# Patient Record
Sex: Male | Born: 1995 | Race: White | Hispanic: No | Marital: Single | Smoking: Current every day smoker
Health system: Southern US, Community
[De-identification: ages and names within clinical notes are randomized; demographics above are authoritative.]

---
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
## 2016-04-24 ENCOUNTER — Other Ambulatory Visit: Payer: Self-pay | Admitting: Internal Medicine

## 2016-04-24 DIAGNOSIS — R1011 Right upper quadrant pain: Secondary | ICD-10-CM

## 2016-05-14 ENCOUNTER — Ambulatory Visit: Payer: BLUE CROSS/BLUE SHIELD

## 2016-05-14 ENCOUNTER — Ambulatory Visit: Admission: RE | Admit: 2016-05-14 | Payer: BLUE CROSS/BLUE SHIELD | Source: Ambulatory Visit

## 2016-05-23 ENCOUNTER — Encounter
Admission: RE | Admit: 2016-05-23 | Discharge: 2016-05-23 | Disposition: A | Discharge status: Home / Self Care | Payer: BLUE CROSS/BLUE SHIELD | Source: Ambulatory Visit | Attending: Internal Medicine | Admitting: Internal Medicine

## 2016-05-23 ENCOUNTER — Ambulatory Visit
Admission: RE | Admit: 2016-05-23 | Discharge: 2016-05-23 | Disposition: A | Discharge status: Home / Self Care | Payer: BLUE CROSS/BLUE SHIELD | Source: Ambulatory Visit | Attending: Internal Medicine | Admitting: Internal Medicine

## 2016-05-23 DIAGNOSIS — R1011 Right upper quadrant pain: Secondary | ICD-10-CM | POA: Diagnosis not present

## 2016-05-23 MED ORDER — TECHNETIUM TC 99M MEBROFENIN IV KIT
5.4300 | PACK | Freq: Once | INTRAVENOUS | Status: AC | PRN
  Administered 2016-05-23: 5.43 via INTRAVENOUS

## 2019-05-15 ENCOUNTER — Other Ambulatory Visit: Payer: Self-pay

## 2019-05-15 ENCOUNTER — Emergency Department
Admission: EM | Admit: 2019-05-15 | Discharge: 2019-05-15 | Disposition: A | Discharge status: Home / Self Care | Payer: BC Managed Care – PPO | Attending: Emergency Medicine | Admitting: Emergency Medicine

## 2019-05-15 ENCOUNTER — Encounter: Payer: Self-pay | Admitting: Emergency Medicine

## 2019-05-15 DIAGNOSIS — F1910 Other psychoactive substance abuse, uncomplicated: Secondary | ICD-10-CM | POA: Diagnosis not present

## 2019-05-15 DIAGNOSIS — Z5321 Procedure and treatment not carried out due to patient leaving prior to being seen by health care provider: Secondary | ICD-10-CM | POA: Diagnosis not present

## 2019-05-15 NOTE — ED Notes (Signed)
Patient to stat desk asking about wait time.

## 2019-05-15 NOTE — ED Notes (Signed)
Patient ambulatory to stat desk in no acute distress asking about wait time. Patient given update on wait time. Patient verbalizes understanding.  

## 2019-05-15 NOTE — ED Triage Notes (Addendum)
Patient states that he is withdrawing for percocet. Patient states that he last used about a day and half ago. Patient states that he uses 50 -60 mg a day. Patient states that he has been taking percocet for about a year. Patient states that he is suppose to go to [facility] on Monday.  Patient states that he is feeling aching and would like something for the symptoms to help him sleep.

## 2020-03-02 ENCOUNTER — Other Ambulatory Visit: Payer: Self-pay

## 2020-03-02 ENCOUNTER — Emergency Department
Admission: EM | Admit: 2020-03-02 | Discharge: 2020-03-03 | Disposition: A | Discharge status: Other Acute Inpatient Hospital | Payer: BC Managed Care – PPO | Source: Home / Self Care | Attending: Emergency Medicine | Admitting: Emergency Medicine

## 2020-03-02 DIAGNOSIS — F29 Unspecified psychosis not due to a substance or known physiological condition: Secondary | ICD-10-CM

## 2020-03-02 DIAGNOSIS — F19951 Other psychoactive substance use, unspecified with psychoactive substance-induced psychotic disorder with hallucinations: Secondary | ICD-10-CM | POA: Diagnosis not present

## 2020-03-02 DIAGNOSIS — F17200 Nicotine dependence, unspecified, uncomplicated: Secondary | ICD-10-CM | POA: Insufficient documentation

## 2020-03-02 DIAGNOSIS — Z20822 Contact with and (suspected) exposure to covid-19: Secondary | ICD-10-CM | POA: Insufficient documentation

## 2020-03-02 DIAGNOSIS — F13151 Sedative, hypnotic or anxiolytic abuse with sedative, hypnotic or anxiolytic-induced psychotic disorder with hallucinations: Secondary | ICD-10-CM | POA: Diagnosis not present

## 2020-03-02 LAB — URINE DRUG SCREEN, QUALITATIVE (ARMC ONLY)
Amphetamines, Ur Screen: POSITIVE — AB
Barbiturates, Ur Screen: NOT DETECTED
Benzodiazepine, Ur Scrn: POSITIVE — AB
Cannabinoid 50 Ng, Ur ~~LOC~~: POSITIVE — AB
Cocaine Metabolite,Ur ~~LOC~~: POSITIVE — AB
MDMA (Ecstasy)Ur Screen: NOT DETECTED
Methadone Scn, Ur: NOT DETECTED
Opiate, Ur Screen: NOT DETECTED
Phencyclidine (PCP) Ur S: NOT DETECTED
Tricyclic, Ur Screen: NOT DETECTED

## 2020-03-02 LAB — CBC
HCT: 40.6 % (ref 39.0–52.0)
Hemoglobin: 14.1 g/dL (ref 13.0–17.0)
MCH: 31.4 pg (ref 26.0–34.0)
MCHC: 34.7 g/dL (ref 30.0–36.0)
MCV: 90.4 fL (ref 80.0–100.0)
Platelets: 198 10*3/uL (ref 150–400)
RBC: 4.49 MIL/uL (ref 4.22–5.81)
RDW: 12.7 % (ref 11.5–15.5)
WBC: 5.7 10*3/uL (ref 4.0–10.5)
nRBC: 0 % (ref 0.0–0.2)

## 2020-03-02 LAB — COMPREHENSIVE METABOLIC PANEL
ALT: 165 U/L — ABNORMAL HIGH (ref 0–44)
AST: 66 U/L — ABNORMAL HIGH (ref 15–41)
Albumin: 4.4 g/dL (ref 3.5–5.0)
Alkaline Phosphatase: 69 U/L (ref 38–126)
Anion gap: 9 (ref 5–15)
BUN: 15 mg/dL (ref 6–20)
CO2: 25 mmol/L (ref 22–32)
Calcium: 9.4 mg/dL (ref 8.9–10.3)
Chloride: 104 mmol/L (ref 98–111)
Creatinine, Ser: 0.8 mg/dL (ref 0.61–1.24)
GFR calc Af Amer: >60 mL/min (ref >60)
GFR calc non Af Amer: >60 mL/min (ref >60)
Glucose, Bld: 90 mg/dL (ref 70–99)
Potassium: 3.9 mmol/L (ref 3.5–5.1)
Sodium: 138 mmol/L (ref 135–145)
Total Bilirubin: 1.2 mg/dL (ref 0.3–1.2)
Total Protein: 6.9 g/dL (ref 6.5–8.1)

## 2020-03-02 LAB — ETHANOL: Alcohol, Ethyl (B): <10 mg/dL (ref <10)

## 2020-03-02 MED ORDER — HALOPERIDOL LACTATE 5 MG/ML IJ SOLN
5.0000 mg | Freq: Once | INTRAMUSCULAR | Status: AC
  Administered 2020-03-02: 5 mg via INTRAMUSCULAR
  Filled 2020-03-02: qty 1

## 2020-03-02 MED ORDER — LORAZEPAM 2 MG/ML IJ SOLN
2.0000 mg | Freq: Once | INTRAMUSCULAR | Status: AC
  Administered 2020-03-02: 2 mg via INTRAMUSCULAR
  Filled 2020-03-02: qty 1

## 2020-03-02 MED ORDER — DIPHENHYDRAMINE HCL 50 MG/ML IJ SOLN
50.0000 mg | Freq: Once | INTRAMUSCULAR | Status: AC
  Administered 2020-03-02: 50 mg via INTRAMUSCULAR
  Filled 2020-03-02: qty 1

## 2020-03-02 NOTE — ED Notes (Signed)
Pt changed into behavorial clothing by this tech and EDT Wilson Singer, the following items were placed into belongings bag, labeled with pt sticker and placed at [employer] nurses (Amy) desk   Pt belongings include 3 pocket knife given to security officer [place] pair of shoes,  1 pair of black socks, 1 pair of kaki cargo shorts,  1 grey and red shirt,  1 back full of other belongings that were in a forensic bag from [employer], this tech and EDT Judeth Cornfield G did not remove items.  1 pair of blue and red plaid boxer shorts.

## 2020-03-02 NOTE — BH Assessment (Signed)
TTS consult unable to be complete due to patient not being able to participate in the interview, as a result of his incoherent stated of mind (intoxicated).

## 2020-03-02 NOTE — ED Notes (Signed)
Pt sleepy and unable to answer questions.

## 2020-03-02 NOTE — ED Notes (Signed)
Pt in hallway trying to leave, crying and yelling at officer.  EDP made aware and medications administered as ordered.

## 2020-03-02 NOTE — ED Notes (Signed)
Pt woke up and started walking around room, unsteady. Pt redirected back to bed.  Fall pads placed at bedside

## 2020-03-02 NOTE — ED Provider Notes (Signed)
[facility] Emergency Department Provider Note   ____________________________________________    I have reviewed the triage vital signs and the nursing notes.   HISTORY  Chief Complaint IVC     HPI Samuel Contreras is a 24 y.o. male brought in by police department under IVC.  Patient was apparently at the local [employer] opening up bottles of soda and milk and drinking them without paying for them and speaking with slurred speech.  He was apparently arrested and police found drugs in his wallet.  He became quite upset and made comments about hurting himself.  He states that he has painful blisters on his feet which is why he is walking unusually.  He does tell me that he "hates his life "but does not think that he would harm himself  History reviewed. No pertinent past medical history.  There are no problems to display for this patient.   History reviewed. No pertinent surgical history.  Prior to Admission medications   Not on File     Allergies Patient has no known allergies.  No family history on file.  Social History Social History   Tobacco Use  . Smoking status: Current Every Day Smoker  . Smokeless tobacco: Never Used  Substance Use Topics  . Alcohol use: Yes  . Drug use: Not on file    Comment: percocet    Review of Systems  Constitutional: No fever/chills Eyes: No visual changes.  ENT: No sore throat. Cardiovascular: Denies chest pain. Respiratory: Denies shortness of breath. Gastrointestinal: No abdominal pain.  No nausea, no vomiting.   Genitourinary: Negative for dysuria. Musculoskeletal: Painful feet Skin: Blisters to the feet as above Neurological: Negative for headaches or weakness   ____________________________________________   PHYSICAL EXAM:  VITAL SIGNS: ED Triage Vitals  Enc Vitals Group     BP 03/02/20 1244 135/87     Pulse Rate 03/02/20 1244 79     Resp 03/02/20 1244 18     Temp 03/02/20 1244 98 F  (36.7 C)     Temp Source 03/02/20 1244 Oral     SpO2 03/02/20 1244 96 %     Weight 03/02/20 1245 104.3 kg (230 lb)     Height 03/02/20 1245 1.829 m (6')     Head Circumference --      Peak Flow --      Pain Score 03/02/20 1254 0     Pain Loc --      Pain Edu? --      Excl. in [place]? --     Constitutional: Alert and oriented. Eyes: Conjunctivae are normal.  Head: Atraumatic.  Mouth/Throat: Mucous membranes are moist.   Neck:  Painless ROM Cardiovascular: Normal rate, regular rhythm. Kermit Balo peripheral circulation. Respiratory: Normal respiratory effort.  No retractions.   Musculoskeletal: No lower extremity tenderness nor edema.  Warm and well perfused.  Patient with large blisters to the plantar surface of the forefoot bilaterally Neurologic:  Normal speech and language. No gross focal neurologic deficits are appreciated.  Skin:  Skin is warm, dry and intact.  Psychiatric: Mood and affect are normal. Speech and behavior are normal.  ____________________________________________   LABS (all labs ordered are listed, but only abnormal results are displayed)  Labs Reviewed  COMPREHENSIVE METABOLIC PANEL - Abnormal; Notable for the following components:      Result Value   AST 66 (*)    ALT 165 (*)    All other components within normal limits  URINE DRUG SCREEN, QUALITATIVE ([facility] ONLY) - Abnormal; Notable for the following components:   Amphetamines, Ur Screen POSITIVE (*)    Cocaine Metabolite,Ur [place] POSITIVE (*)    Cannabinoid 50 Ng, Ur [place] POSITIVE (*)    Benzodiazepine, Ur Scrn POSITIVE (*)    All other components within normal limits  ETHANOL  CBC   ____________________________________________  EKG   ____________________________________________  RADIOLOGY   ____________________________________________   PROCEDURES  Procedure(s) performed: No  Procedures   Critical Care performed: No ____________________________________________   INITIAL IMPRESSION /  ASSESSMENT AND PLAN / ED COURSE  Pertinent labs & imaging results that were available during my care of the patient were reviewed by me and considered in my medical decision making (see chart for details).  Patient presents under IVC for unusual behavior, suicidal remarks to police.  Given his statements to me I will complete the IVC.  Lab work today is overall quite reassuring  UDS is positive for amphetamines cannabis benzos and cocaine  He is medically cleared for psychiatry consultation  The patient has been placed in psychiatric observation due to the need to provide a safe environment for the patient while obtaining psychiatric consultation and evaluation, as well as ongoing medical and medication management to treat the patient's condition.  The patient has been placed under full IVC at this time.       ____________________________________________   FINAL CLINICAL IMPRESSION(S) / ED DIAGNOSES  Final diagnoses:  None        Note:  This document was prepared using Dragon voice recognition software and may include unintentional dictation errors.   Jene Every, MD 03/02/20 [phone]

## 2020-03-02 NOTE — ED Triage Notes (Signed)
Brought to ED via IVC, pt stated that he wanted to hurt himself while at the jail. Pt possibility under influence of cocaine. Pt denies wanting to hurt himself now. Denies HI.

## 2020-03-02 NOTE — BH Assessment (Signed)
Patient is sedated and is currently unavailable to provide a meaningful assessment.

## 2020-03-02 NOTE — ED Notes (Signed)
Pt sitting on side of bed with his head in his lap.

## 2020-03-03 ENCOUNTER — Inpatient Hospital Stay
Admission: RE | Admit: 2020-03-03 | Discharge: 2020-03-06 | DRG: 897 | Disposition: A | Discharge status: Home / Self Care | Payer: BC Managed Care – PPO | Source: Intra-hospital | Attending: Psychiatry | Admitting: Psychiatry

## 2020-03-03 ENCOUNTER — Other Ambulatory Visit: Payer: Self-pay

## 2020-03-03 ENCOUNTER — Encounter: Payer: Self-pay | Admitting: Psychiatry

## 2020-03-03 DIAGNOSIS — F19951 Other psychoactive substance use, unspecified with psychoactive substance-induced psychotic disorder with hallucinations: Secondary | ICD-10-CM

## 2020-03-03 DIAGNOSIS — Z79899 Other long term (current) drug therapy: Secondary | ICD-10-CM

## 2020-03-03 DIAGNOSIS — S90822A Blister (nonthermal), left foot, initial encounter: Secondary | ICD-10-CM | POA: Diagnosis present

## 2020-03-03 DIAGNOSIS — F11151 Opioid abuse with opioid-induced psychotic disorder with hallucinations: Secondary | ICD-10-CM | POA: Diagnosis present

## 2020-03-03 DIAGNOSIS — Z8719 Personal history of other diseases of the digestive system: Secondary | ICD-10-CM

## 2020-03-03 DIAGNOSIS — S90821A Blister (nonthermal), right foot, initial encounter: Secondary | ICD-10-CM | POA: Diagnosis present

## 2020-03-03 DIAGNOSIS — F909 Attention-deficit hyperactivity disorder, unspecified type: Secondary | ICD-10-CM | POA: Diagnosis present

## 2020-03-03 DIAGNOSIS — F17210 Nicotine dependence, cigarettes, uncomplicated: Secondary | ICD-10-CM | POA: Diagnosis present

## 2020-03-03 DIAGNOSIS — F13151 Sedative, hypnotic or anxiolytic abuse with sedative, hypnotic or anxiolytic-induced psychotic disorder with hallucinations: Principal | ICD-10-CM | POA: Diagnosis present

## 2020-03-03 DIAGNOSIS — K219 Gastro-esophageal reflux disease without esophagitis: Secondary | ICD-10-CM | POA: Diagnosis present

## 2020-03-03 DIAGNOSIS — Z20822 Contact with and (suspected) exposure to covid-19: Secondary | ICD-10-CM | POA: Diagnosis present

## 2020-03-03 DIAGNOSIS — F419 Anxiety disorder, unspecified: Secondary | ICD-10-CM | POA: Diagnosis present

## 2020-03-03 DIAGNOSIS — F1310 Sedative, hypnotic or anxiolytic abuse, uncomplicated: Secondary | ICD-10-CM

## 2020-03-03 DIAGNOSIS — F1110 Opioid abuse, uncomplicated: Secondary | ICD-10-CM

## 2020-03-03 DIAGNOSIS — X58XXXA Exposure to other specified factors, initial encounter: Secondary | ICD-10-CM | POA: Diagnosis present

## 2020-03-03 DIAGNOSIS — R001 Bradycardia, unspecified: Secondary | ICD-10-CM | POA: Diagnosis present

## 2020-03-03 LAB — SARS CORONAVIRUS 2 BY RT PCR (HOSPITAL ORDER, PERFORMED IN ~~LOC~~ HOSPITAL LAB): SARS Coronavirus 2: NEGATIVE

## 2020-03-03 MED ORDER — LORAZEPAM 2 MG PO TABS
2.0000 mg | ORAL_TABLET | ORAL | Status: DC | PRN
  Filled 2020-03-03: qty 1

## 2020-03-03 MED ORDER — TRAZODONE HCL 50 MG PO TABS
50.0000 mg | ORAL_TABLET | Freq: Every evening | ORAL | Status: DC | PRN
  Administered 2020-03-04: 50 mg via ORAL
  Filled 2020-03-03: qty 1

## 2020-03-03 MED ORDER — MAGNESIUM HYDROXIDE 400 MG/5ML PO SUSP: 30.0000 mL | Freq: Every day | ORAL | Status: DC | PRN

## 2020-03-03 MED ORDER — ACETAMINOPHEN 325 MG PO TABS: 650.0000 mg | ORAL_TABLET | Freq: Four times a day (QID) | ORAL | Status: DC | PRN

## 2020-03-03 MED ORDER — ZIPRASIDONE MESYLATE 20 MG IM SOLR: 20.0000 mg | Freq: Once | INTRAMUSCULAR | Status: DC | PRN

## 2020-03-03 MED ORDER — HYDROXYZINE HCL 50 MG PO TABS
50.0000 mg | ORAL_TABLET | Freq: Three times a day (TID) | ORAL | Status: DC | PRN
  Administered 2020-03-03 – 2020-03-05 (×3): 50 mg via ORAL
  Filled 2020-03-03 (×3): qty 1

## 2020-03-03 MED ORDER — QUETIAPINE FUMARATE 25 MG PO TABS
50.0000 mg | ORAL_TABLET | Freq: Two times a day (BID) | ORAL | Status: DC
  Administered 2020-03-03 – 2020-03-05 (×4): 50 mg via ORAL
  Filled 2020-03-03 (×5): qty 2

## 2020-03-03 MED ORDER — ALUM & MAG HYDROXIDE-SIMETH 200-200-20 MG/5ML PO SUSP: 30.0000 mL | ORAL | Status: DC | PRN

## 2020-03-03 NOTE — ED Notes (Signed)
Pt given lunch tray.

## 2020-03-03 NOTE — ED Notes (Signed)
Report given to [place], [place]

## 2020-03-03 NOTE — ED Notes (Addendum)
Pt sleeping att and wouldn't wake to speak to TTS and psych   Pt with even and unlabored respirations

## 2020-03-03 NOTE — BH Assessment (Signed)
Late Entry- Writer received a phone call from patient's father asking for an update. Writer informed him he was going to be admitted to [facility]. Writer further informed him about having a different pass code for the unit, from the ER and if the patient didn't give it to him, he wouldn't be able to get any updates and staff cannot tell him if the patient is or isn't on the unit. Father voiced his understanding.

## 2020-03-03 NOTE — BH Assessment (Signed)
Assessment Note  Samuel Contreras is an 24 y.o. male who presents to the ER via law enforcement because he was in local grocery store with odd behaviors, erratic and incoherent. Per IVC patient was opening different drinks and other items eating them while walking around the store. Per the patient father, he unsure why he was brought to the ER. He received a phone call from law enforcement about his behaviors and what took place at the store. Father further reports of having concerns about the patient and his mental health.  During the interview, the patient was initially cooperative, calm and pleasant. However, patient became liable and tearful. Patient was having difficulty focusing on conversation. Patient UDS was positive for Amphetamines, Cocaine, Cannabis & Benzodiazepine. BAC was negative.  Diagnosis: Psychosis  Past Medical History: History reviewed. No pertinent past medical history.  History reviewed. No pertinent surgical history.  Family History: No family history on file.  Social History:  reports that he has been smoking. He has never used smokeless tobacco. He reports current alcohol use. No history on file for drug.  Additional Social History:  Alcohol / Drug Use Pain Medications: See PTA Prescriptions: See PTA Over the Counter: See PTA History of alcohol / drug use?: Yes Longest period of sobriety (when/how long): Unable to quantify Negative Consequences of Use: Personal relationships, Legal Substance #1 Name of Substance 1: Amphetamines Substance #2 Name of Substance 2: Cocaine Substance #3 Name of Substance 3: Cannabis Substance #4 Name of Substance 4: Benzodiazepine  CIWA: CIWA-Ar BP: (!) 103/46 Pulse Rate: 62 COWS:    Allergies: No Known Allergies  Home Medications: (Not in a hospital admission)   OB/GYN Status:  No LMP for male patient.  General Assessment Data Location of Assessment: [facility] ED TTS Assessment: In system Is this a Tele or Face-to-Face  Assessment?: Face-to-Face Is this an Initial Assessment or a Re-assessment for this encounter?: Initial Assessment Patient Accompanied by:: N/A Language Other than English: No Living Arrangements: Other (Comment)(Private Home) What gender do you identify as?: Male Marital status: Single Pregnancy Status: No Living Arrangements: Parent Can pt return to current living arrangement?: Yes Admission Status: Involuntary Petitioner: Police Is patient capable of signing voluntary admission?: No(Under IVC) Referral Source: Self/Family/Friend Insurance type: Insurance risk surveyor Exam ([place]) Medical Exam completed: Yes  Crisis Care Plan Living Arrangements: Parent Legal Guardian: Other:(Self) Name of Psychiatrist: Reports of none Name of Therapist: Reports of none  Education Status Is patient currently in school?: No Is the patient employed, unemployed or receiving disability?: Unemployed  Risk to self with the past 6 months Suicidal Ideation: No Has patient been a risk to self within the past 6 months prior to admission? : No Suicidal Intent: No Has patient had any suicidal intent within the past 6 months prior to admission? : No Is patient at risk for suicide?: No Suicidal Plan?: No Has patient had any suicidal plan within the past 6 months prior to admission? : No Access to Means: No What has been your use of drugs/alcohol within the last 12 months?: Amphetamines, Cocaine, Cannabis & Benzodiazepine Previous Attempts/Gestures: No How many times?: 0 Other Self Harm Risks: Active drug use Triggers for Past Attempts: None known Intentional Self Injurious Behavior: None Family Suicide History: Unknown Recent stressful life event(s): Conflict (Comment), Legal Issues, Other (Comment) Persecutory voices/beliefs?: No Depression: Yes Depression Symptoms: Tearfulness, Insomnia, Fatigue, Guilt, Loss of interest in usual pleasures, Feeling worthless/self pity Substance abuse  history and/or treatment for substance  abuse?: Yes Suicide prevention information given to non-admitted patients: Not applicable  Risk to Others within the past 6 months Homicidal Ideation: No Does patient have any lifetime risk of violence toward others beyond the six months prior to admission? : No Thoughts of Harm to Others: No Current Homicidal Intent: No Current Homicidal Plan: No Access to Homicidal Means: No Identified Victim: Reports of none History of harm to others?: No Assessment of Violence: None Noted Violent Behavior Description: Reports of none Does patient have access to weapons?: No Criminal Charges Pending?: No Does patient have a court date: No Is patient on probation?: No  Psychosis Hallucinations: None noted Delusions: None noted  Mental Status Report Appearance/Hygiene: Unremarkable, In scrubs Eye Contact: Fair Motor Activity: Freedom of movement, Unremarkable Speech: Unremarkable, Incoherent Level of Consciousness: Alert Mood: Anxious, Helpless, Irritable, Sad, Pleasant Affect: Appropriate to circumstance, Depressed, Anxious, Sad Anxiety Level: Minimal Thought Processes: Coherent, Relevant Judgement: Partial Orientation: Person, Place, Time, Situation, Appropriate for developmental age Obsessive Compulsive Thoughts/Behaviors: Moderate  Cognitive Functioning Concentration: Decreased Memory: Remote Intact, Recent Impaired Is patient IDD: No Insight: Fair Impulse Control: Fair Appetite: Good Have you had any weight changes? : No Change Sleep: Decreased Total Hours of Sleep: 5 Vegetative Symptoms: None  ADLScreening [facility] Assessment Services) Patient's cognitive ability adequate to safely complete daily activities?: Yes Patient able to express need for assistance with ADLs?: Yes Independently performs ADLs?: Yes (appropriate for developmental age)  Prior Inpatient Therapy Prior Inpatient Therapy: No  Prior Outpatient Therapy Prior Outpatient  Therapy: No Does patient have an ACCT team?: No Does patient have Intensive In-House Services?  : No Does patient have [facility] services? : No Does patient have P4CC services?: No  ADL Screening (condition at time of admission) Patient's cognitive ability adequate to safely complete daily activities?: Yes Is the patient deaf or have difficulty hearing?: No Does the patient have difficulty seeing, even when wearing glasses/contacts?: No Does the patient have difficulty concentrating, remembering, or making decisions?: No Patient able to express need for assistance with ADLs?: Yes Does the patient have difficulty dressing or bathing?: No Independently performs ADLs?: Yes (appropriate for developmental age) Does the patient have difficulty walking or climbing stairs?: No Weakness of Legs: None Weakness of Arms/Hands: None  Home Assistive Devices/Equipment Home Assistive Devices/Equipment: None  Therapy Consults (therapy consults require a physician order) PT Evaluation Needed: No OT Evalulation Needed: No SLP Evaluation Needed: No Abuse/Neglect Assessment (Assessment to be complete while patient is alone) Abuse/Neglect Assessment Can Be Completed: Yes Physical Abuse: Denies Verbal Abuse: Denies Sexual Abuse: Denies Exploitation of patient/patient's resources: Denies Self-Neglect: Denies Values / Beliefs Cultural Requests During Hospitalization: None Spiritual Requests During Hospitalization: None Consults Spiritual Care Consult Needed: No Transition of Care Team Consult Needed: No Advance Directives (For Healthcare) Does Patient Have a Medical Advance Directive?: No  Child/Adolescent Assessment Running Away Risk: Denies(Patient is an adult)  Disposition:  Disposition Initial Assessment Completed for this Encounter: Yes  On Site Evaluation by:   Reviewed with Physician:    Lilyan Gilford MS, LCAS, [facility], [facility] Therapeutic Triage Specialist 03/03/2020 2:49 PM

## 2020-03-03 NOTE — ED Notes (Signed)
Pt sleeping soundly, resting on right side prone, sides up for safety, even and unlabored respirations

## 2020-03-03 NOTE — BH Assessment (Signed)
  Pt was not awakened by voice and was unagreeable to complete assessment. Patient actively resists arousal Pt unable to provide history at this time due to AMS.

## 2020-03-03 NOTE — Consult Note (Signed)
[facility] Face-to-Face Psychiatry Consult   Reason for Consult: Bizarre behavior Referring Physician: EDP Patient Identification: Samuel Contreras MRN:  [id number] Principal Diagnosis: <principal problem not specified> Diagnosis:  Active Problems:   * No active hospital problems. *   Total Time spent with patient: 15 minutes  Subjective:   Samuel Contreras is a 24 y.o. male patient admitted after being admitted to the local emergency department by [employer].  Citing patient was seen in [place] with bizarre behavior.  Patient is denying suicidal or homicidal ideations.  However I do not see reports patient made statements about self-harm.  denies auditory or visual hallucinations.  However,   [place] presents with mood irritability, circumstantial and can be disorganized throughout this assessment.  Patient is very labile and requesting " discharge."  Patient did start crying as the nurse practitioner and TTS counselor left the unit. Patient then started yelling.  Will attempt to collect additional collateral.  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  Reports patient is prescribedm medications however has not taken medications in a while.  UDS reviewed patient positive for amphetamines, cocaine, benzodiazepines and marijuana.  Inpatient admission recommended for mood stabilization.  Staff to continue to monitor for safety.  Support, encouragement and  reassurance was provided.  HPI:  Per admission assessment: Samuel Contreras is a 24 y.o. male brought in by police department under IVC.  Patient was apparently at the local [employer] opening up bottles of soda and milk and drinking them without paying for them and speaking with slurred speech.  He was apparently arrested and police found drugs in his wallet.  He became quite upset and made comments about hurting himself.  He states that he has painful blisters on his feet which is why he is walking  unusually.  He does tell me that he "hates his life "but does not think that he would harm himself.   Past Psychiatric History:    Risk to Self:   Risk to Others:   Prior Inpatient Therapy:   Prior Outpatient Therapy:    Past Medical History: History reviewed. No pertinent past medical history. History reviewed. No pertinent surgical history. Family History: No family history on file. Family Psychiatric  History:  Social History:  Social History   Substance and Sexual Activity  Alcohol Use Yes     Social History   Substance and Sexual Activity  Drug Use Not on file   Comment: percocet    Social History   Socioeconomic History  . Marital status: Single    Spouse name: Not on file  . Number of children: Not on file  . Years of education: Not on file  . Highest education level: Not on file  Occupational History  . Not on file  Tobacco Use  . Smoking status: Current Every Day Smoker  . Smokeless tobacco: Never Used  Substance and Sexual Activity  . Alcohol use: Yes  . Drug use: Not on file    Comment: percocet  . Sexual activity: Not on file  Other Topics Concern  . Not on file  Social History Narrative  . Not on file   Social Determinants of Health   Financial Resource Strain:   . Difficulty of Paying Living Expenses:   Food Insecurity:   . Worried About Programme researcher, broadcasting/film/video in the Last Year:   . Barista in the Last Year:   Transportation Needs:   .  Lack of Transportation (Medical):   Marland Kitchen Lack of Transportation (Non-Medical):   Physical Activity:   . Days of Exercise per Week:   . Minutes of Exercise per Session:   Stress:   . Feeling of Stress :   Social Connections:   . Frequency of Communication with Friends and Family:   . Frequency of Social Gatherings with Friends and Family:   . Attends Religious Services:   . Active Member of Clubs or Organizations:   . Attends Banker Meetings:   Marland Kitchen Marital Status:    Additional Social  History:    Allergies:  No Known Allergies  Labs:  Results for orders placed or performed during the hospital encounter of 03/02/20 (from the past 48 hour(s))  Comprehensive metabolic panel     Status: Abnormal   Collection Time: 03/02/20 12:45 PM  Result Value Ref Range   Sodium 138 135 - 145 mmol/L   Potassium 3.9 3.5 - 5.1 mmol/L   Chloride 104 98 - 111 mmol/L   CO2 25 22 - 32 mmol/L   Glucose, Bld 90 70 - 99 mg/dL    Comment: Glucose reference range applies only to samples taken after fasting for at least 8 hours.   BUN 15 6 - 20 mg/dL   Creatinine, Ser 7.86 0.61 - 1.24 mg/dL   Calcium 9.4 8.9 - 75.4 mg/dL   Total Protein 6.9 6.5 - 8.1 g/dL   Albumin 4.4 3.5 - 5.0 g/dL   AST 66 (H) 15 - 41 U/L   ALT 165 (H) 0 - 44 U/L   Alkaline Phosphatase 69 38 - 126 U/L   Total Bilirubin 1.2 0.3 - 1.2 mg/dL   GFR calc non Af Amer >60 >60 mL/min   GFR calc Af Amer >60 >60 mL/min   Anion gap 9 5 - 15    Comment: Performed at [facility], [address] [place], [place] [address]  Ethanol     Status: None   Collection Time: 03/02/20 12:45 PM  Result Value Ref Range   Alcohol, Ethyl (B) <10 <10 mg/dL    Comment: (NOTE) Lowest detectable limit for serum alcohol is 10 mg/dL. For medical purposes only. Performed at [facility], [address] [place], [place] [address]   cbc     Status: None   Collection Time: 03/02/20 12:45 PM  Result Value Ref Range   WBC 5.7 4.0 - 10.5 K/uL   RBC 4.49 4.22 - 5.81 MIL/uL   Hemoglobin 14.1 13.0 - 17.0 g/dL   HCT 19.7 58.8 - 32.5 %   MCV 90.4 80.0 - 100.0 fL   MCH 31.4 26.0 - 34.0 pg   MCHC 34.7 30.0 - 36.0 g/dL   RDW 49.8 26.4 - 15.8 %   Platelets 198 150 - 400 K/uL   nRBC 0.0 0.0 - 0.2 %    Comment: Performed at [facility], [address] [place], [place] [address]  Urine Drug Screen, Qualitative     Status: Abnormal   Collection Time: 03/02/20 12:56 PM  Result Value Ref Range   Tricyclic, Ur Screen NONE  DETECTED NONE DETECTED   Amphetamines, Ur Screen POSITIVE (A) NONE DETECTED   MDMA (Ecstasy)Ur Screen NONE DETECTED NONE DETECTED   Cocaine Metabolite,Ur [place] POSITIVE (A) NONE DETECTED   Opiate, Ur Screen NONE DETECTED NONE DETECTED   Phencyclidine (PCP) Ur S NONE DETECTED NONE DETECTED   Cannabinoid 50 Ng, Ur [place] POSITIVE (A) NONE DETECTED   Barbiturates,  Ur Screen NONE DETECTED NONE DETECTED   Benzodiazepine, Ur Scrn POSITIVE (A) NONE DETECTED   Methadone Scn, Ur NONE DETECTED NONE DETECTED    Comment: (NOTE) Tricyclics + metabolites, urine    Cutoff 1000 ng/mL Amphetamines + metabolites, urine  Cutoff 1000 ng/mL MDMA (Ecstasy), urine              Cutoff 500 ng/mL Cocaine Metabolite, urine          Cutoff 300 ng/mL Opiate + metabolites, urine        Cutoff 300 ng/mL Phencyclidine (PCP), urine         Cutoff 25 ng/mL Cannabinoid, urine                 Cutoff 50 ng/mL Barbiturates + metabolites, urine  Cutoff 200 ng/mL Benzodiazepine, urine              Cutoff 200 ng/mL Methadone, urine                   Cutoff 300 ng/mL The urine drug screen provides only a preliminary, unconfirmed analytical test result and should not be used for non-medical purposes. Clinical consideration and professional judgment should be applied to any positive drug screen result due to possible interfering substances. A more specific alternate chemical method must be used in order to obtain a confirmed analytical result. Gas chromatography / mass spectrometry (GC/MS) is the preferred confirmat ory method. Performed at [facility], [address] [place], [place] [address]     No current facility-administered medications for this encounter.   Current Outpatient Medications  Medication Sig Dispense Refill  . amphetamine-dextroamphetamine (ADDERALL) 10 MG tablet Take 10 mg by mouth 2 (two) times daily.    . hydrOXYzine (ATARAX/VISTARIL) 25 MG tablet Take 25 mg by mouth 4 (four) times daily.       Musculoskeletal: Strength & Muscle Tone: within normal limits Gait & Station: normal Patient leans: N/A  Psychiatric Specialty Exam: Physical Exam  Review of Systems  Blood pressure (!) 103/46, pulse 62, temperature 97.9 F (36.6 C), temperature source Oral, resp. rate 16, height 6' (1.829 m), weight 104.3 kg, SpO2 99 %.Body mass index is 31.19 kg/m.  General Appearance: Disheveled  Eye Contact:  Good  Speech:  Clear and Coherent  Volume:  Normal  Mood:  Anxious and Depressed  Affect:  Appropriate  Thought Process:  Coherent  Orientation:  Full (Time, Place, and Person)  Thought Content:  Hallucinations: None and Rumination  Suicidal Thoughts:  No  Homicidal Thoughts:  No  Memory:  Immediate;   Fair Recent;   Fair  Judgement:  Fair  Insight:  Fair  Psychomotor Activity:  Normal  Concentration:  Concentration: Fair  Recall:  [employer] of Knowledge:  Fair  Language:  Fair  Akathisia:  No  Handed:  Right  AIMS (if indicated):     Assets:  Communication Skills Desire for Improvement Resilience Social Support  ADL's:  Intact  Cognition:  WNL  Sleep:      EDP made aware of discharge recommendation   Treatment Plan Summary: Daily contact with patient to assess and evaluate symptoms and progress in treatment and Medication management  Disposition: Recommend psychiatric Inpatient admission when medically cleared.   Restart home meds where appropriate Consider initiating Seroquel 50 mg p.o. nightly EKG, CBC, Prolactin, Lipid panel  and THS- pending results COIVD - Screening- pending results   Derrill Center, NP 03/03/2020 11:15 AM

## 2020-03-03 NOTE — Tx Team (Signed)
Initial Treatment Plan 03/03/2020 3:34 PM [name] [id number]    PATIENT STRESSORS: Financial difficulties Occupational concerns Substance abuse   PATIENT STRENGTHS: Ability for insight Active sense of humor Average or above average intelligence   PATIENT IDENTIFIED PROBLEMS: Substance Use  Unstable Mood                    DISCHARGE CRITERIA:  Ability to meet basic life and health needs Adequate post-discharge living arrangements  PRELIMINARY DISCHARGE PLAN: Return to previous living arrangement Return to previous work or school arrangements  PATIENT/FAMILY INVOLVEMENT: This treatment plan has been presented to and reviewed with the patient, Samuel Contreras.  The patient and family have been given the opportunity to ask questions and make suggestions.  Berkley Harvey, RN 03/03/2020, 3:34 PM

## 2020-03-03 NOTE — H&P (Signed)
Psychiatric Admission Assessment Adult  Patient Identification: Samuel Contreras N Molder MRN:  [id number] Date of Evaluation:  03/03/2020 Chief Complaint:  Substance-induced psychotic disorder with hallucinations (HCC) [F19.951] Principal Diagnosis: <principal problem not specified> Diagnosis:  Active Problems:   Substance-induced psychotic disorder with hallucinations [facility](HCC)  Mr. Luan PullingRich is a 24yo M with past psychiatric history of ADHD and medical history of gastritis, who [facility] due to bizarre behavior in public place.  History of Present Illness: [facility]. He appears very upset due to this admission, was tearful and yelling out during the phone conversation with his mother. He was very limitedly-cooperative with the interview. He refused to discuss circumstances of this admission. He reports "I am fine". He denies feeling depressed, reports feeling anxious and asks for medication for that. He denies suicidal or homicidal thoughts. He is unclear if he is feeling paranoid or unsafe. He asks to leave him alone and let him sleep. He let me to perform a quick physical exam though.  Per Psych Consult Note: Patient brought to ER by [employer], citing patient was seen in [employer] with bizarre behavior. Patient is denying suicidal or homicidal ideations.  However I do not see reports patient made statements about self-harm.  denies auditory or visual hallucinations.  However, Gerre PebblesGarrett presents with mood irritability, circumstantial and can be disorganized throughout this assessment.  Patient is very labile and requesting " discharge."  Patient did start crying as the nurse practitioner and TTS counselor left the unit. Patient then started yelling.  Will attempt to collect additional collateral.  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  Reports patient is prescribedm medications however has not taken  medications in a while.  UDS reviewed patient positive for amphetamines, cocaine, benzodiazepines and marijuana.  Inpatient admission recommended for mood stabilization.    Per ER admission assessment: Patient was apparently at the local [employer] opening up bottles of soda and milk and drinking them without paying for them and speaking with slurred speech.He was apparently arrested and police found drugs in his wallet. He became quite upset and made comments about hurting himself. He states that he has painful blisters on his feet which is why he is walking unusually. He does tell me that he "hates his life "but does not think that he would harm himself.   Per chart review I found the only note re [facility] on 01/[facility]: "Samuel Contreras N Fann is a 24 y.o. here for an acute issue. He has a PMH of ADHD, gastritis who presents for refills on Adderall and promethazine. He takes occasional hydroxyzine for anxiety. He also takes Adderall intermittently."  STATE PRESCRIPTION DRUG MONITORING PROGRAM: 12/12/2019 1 12/12/2019 Dextroamp-Amphetamin 10 Mg Tab 60.00 30 Ma Mil [id number] 786 ([id number]) 0/0  Comm Ins [place] 11/14/2019 1 11/14/2019 Dextroamp-Amphetamin 10 Mg Tab 60.00 30 Ma Mil [id number] 786 ([id number]) 0/0  Private Pay [place] 10/31/2019 1 10/04/2019 Dextroamp-Amphetamin 10 Mg Tab 60.00 30 Ma Mil [id number] 786 ([id number]) 0/0  Comm Ins [place] 09/30/2019 1 08/25/2019 Dextroamp-Amphetamin 10 Mg Tab 60.00 30 Ma Mil [id number] 786 ([id number]) 0/0  Comm Ins [place] 08/25/2019 1 06/30/2019 Dextroamp-Amphetamin 10 Mg Tab 30.00 30 Ma Mil [id number] 786 ([id number]) 0/0  Comm Ins [place] 07/27/2019 1 07/21/2019 Dextroamp-Amphetamin 10 Mg Tab 30.00 30 Ma Mil [id number] 786 ([id number]) 0/0  Comm Ins [place] 07/07/2019 1 07/07/2019 Buprenorphine-Nalox 8-2mg  Film 16.00 8 An  Ale [id number] 786 (3732) 0/0 16.00 mg Comm Ins [place] 06/30/2019 1 03/30/2019 Dextroamp-Amphetamin 10 Mg Tab 30.00 30 Ma Mil [id number] 786 ([id number]) 0/0  Comm  Ins [place] 04/28/2019 1 04/28/2019 Dextroamp-Amphetamin 10 Mg Tab 30.00 30 Ma Mil [id number] 786 ([id number]) 0/0  Comm Ins [place]   Past Psychiatric History:  Dx: ADHD No known past psych admissions. No known h/o suicidal attempts Past psych medications: per PDMP, Adderall 10mg  BID, last Rx from 12/12/19; one Rx for Suboxone 8-2mg  BID #16 for 8 days 07/07/19/    Total Time spent with patient: 30 minutes  Past Medical History: History reviewed. No pertinent past medical history. History reviewed. No pertinent surgical history. Family History: History reviewed. No pertinent family history. Family Psychiatric  History: unknown  Tobacco Screening: Have you used any form of tobacco in the last 30 days? (Cigarettes, Smokeless Tobacco, Cigars, and/or Pipes): Yes Tobacco use, Select all that apply: 5 or more cigarettes per day Are you interested in Tobacco Cessation Medications?: Yes, will notify MD for an order Counseled patient on smoking cessation including recognizing danger situations, developing coping skills and basic information about quitting provided: Refused/Declined practical counseling Social History:  Social History   Substance and Sexual Activity  Alcohol Use Yes     Social History   Substance and Sexual Activity  Drug Use Not on file   Comment: percocet    Additional Social History: unknown. Unable to obtain due to patient`s uncoperation.   Allergies:  No Known Allergies Lab Results:  Results for orders placed or performed during the hospital encounter of 03/02/20 (from the past 48 hour(s))  Comprehensive metabolic panel     Status: Abnormal   Collection Time: 03/02/20 12:45 PM  Result Value Ref Range   Sodium 138 135 - 145 mmol/L   Potassium 3.9 3.5 - 5.1 mmol/L   Chloride 104 98 - 111 mmol/L   CO2 25 22 - 32 mmol/L   Glucose, Bld 90 70 - 99 mg/dL    Comment: Glucose reference range applies only to samples taken after fasting for at least 8 hours.   BUN 15 6 - 20 mg/dL    Creatinine, Ser 03/04/20 0.61 - 1.24 mg/dL   Calcium 9.4 8.9 - 4.48 mg/dL   Total Protein 6.9 6.5 - 8.1 g/dL   Albumin 4.4 3.5 - 5.0 g/dL   AST 66 (H) 15 - 41 U/L   ALT 165 (H) 0 - 44 U/L   Alkaline Phosphatase 69 38 - 126 U/L   Total Bilirubin 1.2 0.3 - 1.2 mg/dL   GFR calc non Af Amer >60 >60 mL/min   GFR calc Af Amer >60 >60 mL/min   Anion gap 9 5 - 15    Comment: Performed at [facility], [address] [place], [place]  Ethanol     Status: None   Collection Time: 03/02/20 12:45 PM  Result Value Ref Range   Alcohol, Ethyl (B) <10 <10 mg/dL    Comment: (NOTE) Lowest detectable limit for serum alcohol is 10 mg/dL. For medical purposes only. Performed at [facility], [address] [place], [place]   cbc     Status: None   Collection Time: 03/02/20 12:45 PM  Result Value Ref Range   WBC 5.7 4.0 - 10.5 K/uL   RBC 4.49 4.22 - 5.81 MIL/uL   Hemoglobin 14.1 13.0 - 17.0 g/dL   HCT 03/04/20 78.5 - 88.5 %   MCV 90.4 80.0 - 100.0 fL  MCH 31.4 26.0 - 34.0 pg   MCHC 34.7 30.0 - 36.0 g/dL   RDW 85.6 31.4 - 97.0 %   Platelets 198 150 - 400 K/uL   nRBC 0.0 0.0 - 0.2 %    Comment: Performed at [facility], [address], [place], [place] [address]  Urine Drug Screen, Qualitative     Status: Abnormal   Collection Time: 03/02/20 12:56 PM  Result Value Ref Range   Tricyclic, Ur Screen NONE DETECTED NONE DETECTED   Amphetamines, Ur Screen POSITIVE (A) NONE DETECTED   MDMA (Ecstasy)Ur Screen NONE DETECTED NONE DETECTED   Cocaine Metabolite,Ur [place] POSITIVE (A) NONE DETECTED   Opiate, Ur Screen NONE DETECTED NONE DETECTED   Phencyclidine (PCP) Ur S NONE DETECTED NONE DETECTED   Cannabinoid 50 Ng, Ur [place] POSITIVE (A) NONE DETECTED   Barbiturates, Ur Screen NONE DETECTED NONE DETECTED   Benzodiazepine, Ur Scrn POSITIVE (A) NONE DETECTED   Methadone Scn, Ur NONE DETECTED NONE DETECTED    Comment: (NOTE) Tricyclics + metabolites, urine     Cutoff 1000 ng/mL Amphetamines + metabolites, urine  Cutoff 1000 ng/mL MDMA (Ecstasy), urine              Cutoff 500 ng/mL Cocaine Metabolite, urine          Cutoff 300 ng/mL Opiate + metabolites, urine        Cutoff 300 ng/mL Phencyclidine (PCP), urine         Cutoff 25 ng/mL Cannabinoid, urine                 Cutoff 50 ng/mL Barbiturates + metabolites, urine  Cutoff 200 ng/mL Benzodiazepine, urine              Cutoff 200 ng/mL Methadone, urine                   Cutoff 300 ng/mL The urine drug screen provides only a preliminary, unconfirmed analytical test result and should not be used for non-medical purposes. Clinical consideration and professional judgment should be applied to any positive drug screen result due to possible interfering substances. A more specific alternate chemical method must be used in order to obtain a confirmed analytical result. Gas chromatography / mass spectrometry (GC/MS) is the preferred confirmat ory method. Performed at [facility], [address] [place], [place] [address]   SARS Coronavirus 2 by RT PCR (hospital order, performed in [facility] hospital lab) Nasopharyngeal Nasopharyngeal Swab     Status: None   Collection Time: 03/03/20 12:42 PM   Specimen: Nasopharyngeal Swab  Result Value Ref Range   SARS Coronavirus 2 NEGATIVE NEGATIVE    Comment: (NOTE) SARS-CoV-2 target nucleic acids are NOT DETECTED. The SARS-CoV-2 RNA is generally detectable in upper and lower respiratory specimens during the acute phase of infection. The lowest concentration of SARS-CoV-2 viral copies this assay can detect is 250 copies / mL. A negative result does not preclude SARS-CoV-2 infection and should not be used as the sole basis for treatment or other patient management decisions.  A negative result may occur with improper specimen collection / handling, submission of specimen other than nasopharyngeal swab, presence of viral mutation(s) within  the areas targeted by this assay, and inadequate number of viral copies (<250 copies / mL). A negative result must be combined with clinical observations, patient history, and epidemiological information. Fact Sheet for Patients:   BoilerBrush.com.cy Fact Sheet for Healthcare Providers: https://pope.com/ This test is not yet approved or  cleared  by the [place] and has been authorized for detection and/or diagnosis of SARS-CoV-2 by FDA under an Emergency Use Authorization (EUA).  This EUA will remain in effect (meaning this test can be used) for the duration of the COVID-19 declaration under Section 564(b)(1) of the Act, 21 U.S.C. section 360bbb-3(b)(1), unless the authorization is terminated or revoked sooner. Performed at [facility], [address] [place], [place] [address]     Blood Alcohol level:  Lab Results  Component Value Date   [facility] <10 03/02/2020    Metabolic Disorder Labs:  No results found for: HGBA1C, MPG No results found for: PROLACTIN No results found for: CHOL, TRIG, HDL, CHOLHDL, VLDL, LDLCALC  Current Medications: Current Facility-Administered Medications  Medication Dose Route Frequency Provider Last Rate Last Admin  . acetaminophen (TYLENOL) tablet 650 mg  650 mg Oral Q6H PRN Oneta Rack, NP      . alum & mag hydroxide-simeth (MAALOX/MYLANTA) 200-200-20 MG/5ML suspension 30 mL  30 mL Oral Q4H PRN Oneta Rack, NP      . ziprasidone (GEODON) injection 20 mg  20 mg Intramuscular Once PRN Oneta Rack, NP       And  . LORazepam (ATIVAN) tablet 2 mg  2 mg Oral PRN Oneta Rack, NP      . magnesium hydroxide (MILK OF MAGNESIA) suspension 30 mL  30 mL Oral Daily PRN Oneta Rack, NP      . QUEtiapine (SEROQUEL) tablet 50 mg  50 mg Oral BID Oneta Rack, NP      . traZODone (DESYREL) tablet 50 mg  50 mg Oral QHS PRN Oneta Rack, NP       PTA Medications: Medications  Prior to Admission  Medication Sig Dispense Refill Last Dose  . amphetamine-dextroamphetamine (ADDERALL) 10 MG tablet Take 10 mg by mouth 2 (two) times daily.     . hydrOXYzine (ATARAX/VISTARIL) 25 MG tablet Take 25 mg by mouth 4 (four) times daily.       Musculoskeletal: Strength & Muscle Tone: within normal limits Gait & Station: normal Patient leans: N/A  Psychiatric Specialty Exam: Physical Exam  Constitutional: He appears well-developed and well-nourished.  HENT:  Head: Normocephalic and atraumatic.  Eyes: Pupils are equal, round, and reactive to light. EOM are normal.  Cardiovascular: Normal rate, regular rhythm and normal heart sounds.  Respiratory: Effort normal and breath sounds normal.  GI: Soft.  Musculoskeletal:     Cervical back: Normal range of motion.  Neurological: He is alert.  Skin: Skin is warm and dry.    Review of Systems  Constitutional: Negative for chills and fever.  HENT: Negative for congestion.   Respiratory: Negative for cough and shortness of breath.   Cardiovascular: Negative for chest pain.  Gastrointestinal: Negative for abdominal pain.  Neurological: Negative for tremors and weakness.  Psychiatric/Behavioral: Positive for agitation, behavioral problems and dysphoric mood. The patient is nervous/anxious.     Blood pressure 125/89, pulse 82, temperature 98 F (36.7 C), temperature source Oral, resp. rate 18, height 6' (1.829 m), weight 102.5 kg, SpO2 97 %.Body mass index is 30.65 kg/m.  General Appearance: Negative  Eye Contact:  Absent  Speech:  Garbled  Volume:  Increased  Mood:  Angry, Dysphoric and Irritable  Affect:  Congruent  Thought Process:  Disorganized  Orientation:  Full (Time, Place, and Person)  Thought Content:  Illogical  Suicidal Thoughts:  No  Homicidal Thoughts:  No  Memory:  Immediate;   Fair Recent;   Poor  Judgement:  Impaired  Insight:  Lacking  Psychomotor Activity:  Increased  Concentration:  Concentration:  Poor  Recall:  [place] of Knowledge:  NA  Language:  Fair  Akathisia:  No  Handed:  Right  AIMS (if indicated):     Assets:  Housing Physical Health  ADL's:  Intact  Cognition:  Impaired,  Mild  Sleep:       Treatment Plan Summary: Daily contact with patient to assess and evaluate symptoms and progress in treatment and Medication management    ASSESSMENT: 24yo M with past psychiatric history of ADHD and medical history of gastritis, who was admitted to [facility] unit due to bizarre behavior in public place. Reportedly, he was acting bizarre in grocery store, was apparently arrested and police found drugs in his wallet. He became quite upset and made comments about hurting himself. UDS in ER reviewed patient positive for amphetamines, cocaine, benzodiazepines and marijuana. Currently patient is agitated, but not aggressive; he is minimally-cooperative with the interview, denies suicidal or homicidal thoughts, making unclear paranoid statements.  Psych consultant from ER has already ordered Seroquel 50mg  PO BID and that will be continued, as well as agitation protocol with Geodon and Lorazepam and Trazodone PRN sleep. I added PRN Hydroxyzine TID for anxiety. Patient will be reassessed tomorrow. Will attempt to obtain collateral info from his family. Labwork reviewed, CBC, CMP - grossly unremarkable. BAL <10. UDS as mentioned above. Will order EKG.  Impression: Substance-induced psychosis.  Plan: as mentioned in Assessment above. continue inpatient psych admission; 15-minute checks; daily contact with patient to assess and evaluate symptoms and progress in treatment; psychoeducation.  Observation Level/Precautions:  46min checks  Laboratory:  na   Psychotherapy:    Medications:    Consultations:    Discharge Concerns:    Estimated LOS:  Other:     Physician Treatment Plan for Primary Diagnosis: <principal problem not specified> Long Term Goal(s): Improvement in symptoms so as ready for  discharge  Short Term Goals: Ability to identify changes in lifestyle to reduce recurrence of condition will improve, Ability to verbalize feelings will improve, Ability to disclose and discuss suicidal ideas, Ability to demonstrate self-control will improve, Ability to identify and develop effective coping behaviors will improve, Ability to maintain clinical measurements within normal limits will improve, Compliance with prescribed medications will improve and Ability to identify triggers associated with substance abuse/mental health issues will improve  Physician Treatment Plan for Secondary Diagnosis: Active Problems:   Substance-induced psychotic disorder with hallucinations ([place])  Long Term Goal(s): Improvement in symptoms so as ready for discharge  Short Term Goals: Ability to identify changes in lifestyle to reduce recurrence of condition will improve, Ability to verbalize feelings will improve, Ability to disclose and discuss suicidal ideas, Ability to demonstrate self-control will improve, Ability to identify and develop effective coping behaviors will improve, Ability to maintain clinical measurements within normal limits will improve, Compliance with prescribed medications will improve and Ability to identify triggers associated with substance abuse/mental health issues will improve  I certify that inpatient services furnished can reasonably be expected to improve the patient's condition.    Larita Fife, MD 5/22/20213:55 PM

## 2020-03-03 NOTE — BHH Suicide Risk Assessment (Signed)
[facility] Admission Suicide Risk Assessment   Nursing information obtained from:  Patient Demographic factors:  Male, Unemployed, Low socioeconomic status, Caucasian Current Mental Status:  NA Loss Factors:  Financial problems / change in socioeconomic status Historical Factors:  Impulsivity Risk Reduction Factors:  Living with another person, especially a relative, Sense of responsibility to family  Total Time spent with patient: 20 minutes Principal Problem: <principal problem not specified> Diagnosis:  Active Problems:   Substance-induced psychotic disorder with hallucinations [facility])  Mr. Trentman is a 24yo M with past psychiatric history of ADHD and medical history of gastritis, who was admitted to [facility] unit due to bizarre behavior in public place.  Subjective Data: patient denies suicidal ideations currently.   CLINICAL FACTORS:   Alcohol/Substance Abuse/Dependencies  COGNITIVE FEATURES THAT CONTRIBUTE TO RISK:  None    SUICIDE RISK:   Mild:  Suicidal ideation of limited frequency, intensity, duration, and specificity.  There are no identifiable plans, no associated intent, mild dysphoria and related symptoms, good self-control (both objective and subjective assessment), few other risk factors, and identifiable protective factors, including available and accessible social support.  PLAN OF CARE: continue inpatient psych admission; 15-minute checks; daily contact with patient to assess and evaluate symptoms and progress in treatment; psychoeducation. Continue Seroquel 50mg  PO BID, as well as agitation protocol with Geodon and Lorazepam and Trazodone PRN sleep. I added PRN Hydroxyzine TID for anxiety. Patient will be reassessed tomorrow. Will attempt to obtain collateral info from his family.   I certify that inpatient services furnished can reasonably be expected to improve the patient's condition.   , MD 03/03/2020, 4:32 PM

## 2020-03-03 NOTE — ED Notes (Signed)
Pt woke and c/o of missing mother and father and "I've been here for days and no one is helping me! I want to go home!", pt sobbing and wailing, reports not to know the reason or circumstances of being in ED, pt oriented to IVC and behavior at [employer], pt denies illegal activity  TTS and psych called to evaluate  Pt given ice water and is calming

## 2020-03-03 NOTE — Progress Notes (Signed)
Patient ID: Samuel Contreras, male   DOB: 1996-09-12, 24 y.o.   MRN: [id number]  D: Received patient from ED Quad. Patient skin assessment completed with Doristine Johns RN, skin is intact, no contraband found. Patient denies SI / HI / AVH. Patient is agitated, often tearful while assessment is completed.   A: Patient oriented to unit/room/call light. Patient was offered support and encouragement. Patient was encourage to attend groups, participate in unit activities and continue with plan of care. Q x 15 minute observation checks were completed for safety.   R: Patient has no complaints at this time. Patient is receptive to treatment and safety maintained on unit.

## 2020-03-04 LAB — HEMOGLOBIN A1C
Hgb A1c MFr Bld: 5 % (ref 4.8–5.6)
Mean Plasma Glucose: 96.8 mg/dL

## 2020-03-04 LAB — LIPID PANEL
Cholesterol: 130 mg/dL (ref 0–200)
HDL: 48 mg/dL (ref >40)
LDL Cholesterol: 70 mg/dL (ref 0–99)
Total CHOL/HDL Ratio: 2.7 RATIO
Triglycerides: 59 mg/dL (ref <150)
VLDL: 12 mg/dL (ref 0–40)

## 2020-03-04 LAB — TSH: TSH: 0.33 u[IU]/mL — ABNORMAL LOW (ref 0.350–4.500)

## 2020-03-04 NOTE — Progress Notes (Signed)
Patient was lying in bed and stated that he did not want his scheduled evening medication.

## 2020-03-04 NOTE — Progress Notes (Signed)
Patient remains isolative. Forwards little. Denies SI HI and AVH. Voices no complaints

## 2020-03-04 NOTE — Plan of Care (Signed)
D- Patient alert and oriented. Patient presents in an anxious, but pleasant mood on assessment stating that he slept alright last night and had no major complaints to voice to this Clinical research associate. Patient denies depression, however, he endorsed anxiety, stating that he's "ready to get out of here". Patient also denies SI, HI, AVH, and pain at this time. Patient had no stated goals for today.  A- Scheduled medications administered to patient, per MD orders. Support and encouragement provided.  Routine safety checks conducted every 15 minutes.  Patient informed to notify staff with problems or concerns.  R- No adverse drug reactions noted. Patient contracts for safety at this time. Patient compliant with medications and treatment plan. Patient receptive, calm, and cooperative. Patient interacts well with others on the unit.  Patient remains safe at this time.  Problem: Self-Concept: Goal: Ability to disclose and discuss suicidal ideas will improve Outcome: Progressing Goal: Will verbalize positive feelings about self Outcome: Progressing   Problem: Education: Goal: Knowledge of [facility] Education information/materials will improve Outcome: Progressing Goal: Emotional status will improve Outcome: Progressing Goal: Mental status will improve Outcome: Progressing Goal: Verbalization of understanding the information provided will improve Outcome: Progressing

## 2020-03-04 NOTE — Progress Notes (Signed)
[facility] MD Progress Note  03/04/2020 11:48 AM Samuel Contreras  MRN:  [id number]  Samuel Contreras is a 24yo M with past psychiatric history of ADHD and medical history of gastritis, who was admitted to [facility] unit due to bizarre behavior in public place.  Subjective: Patient is a little more cooperative today, although still reports feeling "very sleepy" and talks with prompts. He reports not remembering circumstances of his admission. He admits using drugs yesterday "Xanax and stuff". He understands he is in [facility] unit now. He reports thinking more clear today. He denies suicidal or homicidal thoughts, plans. He denies any hallucinations, does not express delusions, reports feeling safe in the hospital. He denies physical complaints others than extreme sedation.   Principal Problem: <principal problem not specified> Diagnosis: Active Problems:   Substance-induced psychotic disorder with hallucinations ([place])  Total Time spent with patient: 15 minutes  Past Psychiatric History:  Dx: ADHD, anxiety No known past psych admissions. No known h/o suicidal attempts Past psych medications: per PDMP, Adderall 10mg  BID, last Rx from 12/12/19; one Rx for Suboxone 8-2mg  BID #16 for 8 days 07/07/19/ Per mother, also Wellbutrin.  Past Medical History: History reviewed. No pertinent past medical history. History reviewed. No pertinent surgical history. Family History: History reviewed. No pertinent family history. Family Psychiatric  History: unknown  Social History:  Social History   Substance and Sexual Activity  Alcohol Use Yes     Social History   Substance and Sexual Activity  Drug Use Not on file   Comment: percocet    Social History   Socioeconomic History  . Marital status: Single    Spouse name: Not on file  . Number of children: Not on file  . Years of education: Not on file  . Highest education level: Not on file  Occupational History  . Not on file  Tobacco Use  . Smoking status: Current  Every Day Smoker  . Smokeless tobacco: Never Used  Substance and Sexual Activity  . Alcohol use: Yes  . Drug use: Not on file    Comment: percocet  . Sexual activity: Not on file  Other Topics Concern  . Not on file  Social History Narrative  . Not on file   Social Determinants of Health   Financial Resource Strain:   . Difficulty of Paying Living Expenses:   Food Insecurity:   . Worried About Charity fundraiser in the Last Year:   . Arboriculturist in the Last Year:   Transportation Needs:   . Film/video editor (Medical):   Marland Kitchen Lack of Transportation (Non-Medical):   Physical Activity:   . Days of Exercise per Week:   . Minutes of Exercise per Session:   Stress:   . Feeling of Stress :   Social Connections:   . Frequency of Communication with Friends and Family:   . Frequency of Social Gatherings with Friends and Family:   . Attends Religious Services:   . Active Member of Clubs or Organizations:   . Attends Archivist Meetings:   Marland Kitchen Marital Status:    Additional Social History:                         Sleep: Good  Appetite:  Fair  Current Medications: Current Facility-Administered Medications  Medication Dose Route Frequency Provider Last Rate Last Admin  . acetaminophen (TYLENOL) tablet 650 mg  650 mg Oral Q6H PRN Derrill Center, NP      .  alum & mag hydroxide-simeth (MAALOX/MYLANTA) 200-200-20 MG/5ML suspension 30 mL  30 mL Oral Q4H PRN Oneta Rack, NP      . hydrOXYzine (ATARAX/VISTARIL) tablet 50 mg  50 mg Oral TID PRN Thalia Party, MD   50 mg at 03/04/20 0939  . ziprasidone (GEODON) injection 20 mg  20 mg Intramuscular Once PRN Oneta Rack, NP       And  . LORazepam (ATIVAN) tablet 2 mg  2 mg Oral PRN Oneta Rack, NP      . magnesium hydroxide (MILK OF MAGNESIA) suspension 30 mL  30 mL Oral Daily PRN Oneta Rack, NP      . QUEtiapine (SEROQUEL) tablet 50 mg  50 mg Oral BID Oneta Rack, NP   50 mg at 03/04/20 0940   . traZODone (DESYREL) tablet 50 mg  50 mg Oral QHS PRN Oneta Rack, NP        Lab Results:  Results for orders placed or performed during the hospital encounter of 03/03/20 (from the past 48 hour(s))  TSH     Status: Abnormal   Collection Time: 03/04/20  7:01 AM  Result Value Ref Range   TSH 0.330 (L) 0.350 - 4.500 uIU/mL    Comment: Performed by a 3rd Generation assay with a functional sensitivity of <=0.01 uIU/mL. Performed at [facility], [address] [place], [place] [address]   Lipid panel     Status: None   Collection Time: 03/04/20  7:01 AM  Result Value Ref Range   Cholesterol 130 0 - 200 mg/dL   Triglycerides 59 <595 mg/dL   HDL 48 >63 mg/dL   Total CHOL/HDL Ratio 2.7 RATIO   VLDL 12 0 - 40 mg/dL   LDL Cholesterol 70 0 - 99 mg/dL    Comment:        Total Cholesterol/HDL:CHD Risk Coronary Heart Disease Risk Table                     Men   Women  1/2 Average Risk   3.4   3.3  Average Risk       5.0   4.4  2 X Average Risk   9.6   7.1  3 X Average Risk  23.4   11.0        Use the calculated Patient Ratio above and the CHD Risk Table to determine the patient's CHD Risk.        ATP III CLASSIFICATION (LDL):  <100     mg/dL   Optimal  875-643  mg/dL   Near or Above                    Optimal  130-159  mg/dL   Borderline  329-518  mg/dL   High  >841     mg/dL   Very High Performed at [facility], [address] [place], [place] [address]     Blood Alcohol level:  Lab Results  Component Value Date   [facility] <10 03/02/2020    Metabolic Disorder Labs: No results found for: HGBA1C, MPG No results found for: PROLACTIN Lab Results  Component Value Date   CHOL 130 03/04/2020   TRIG 59 03/04/2020   HDL 48 03/04/2020   CHOLHDL 2.7 03/04/2020   VLDL 12 03/04/2020   LDLCALC 70 03/04/2020    Physical Findings: AIMS:  , ,  ,  ,    CIWA:    COWS:  Musculoskeletal: Strength & Muscle Tone: within normal limits Gait & Station:  normal Patient leans: N/A  Psychiatric Specialty Exam: Physical Exam  Review of Systems  Blood pressure 125/89, pulse 82, temperature 97.9 F (36.6 C), temperature source Oral, resp. rate 18, height 6' (1.829 m), weight 102.5 kg, SpO2 100 %.Body mass index is 30.65 kg/m.  General Appearance: Disheveled  Eye Contact:  Poor  Speech:  Slow  Volume:  Normal  Mood:  Dysphoric  Affect:  Blunt  Thought Process:  Coherent and Goal Directed  Orientation:  Place and person  Thought Content:  Illogical  Suicidal Thoughts:  No  Homicidal Thoughts:  No  Memory:  Immediate;   Fair Recent;   Poor  Judgement:  Other:  limited  Insight:  Shallow  Psychomotor Activity:  Decreased  Concentration:  Concentration: Poor and Attention Span: Poor  Recall:  Poor  Fund of Knowledge:  NA  Language:  Fair  Akathisia:  No  Handed:  Right  AIMS (if indicated):     Assets:  Desire for Improvement Housing Physical Health Social Support  ADL's:  Impaired  Cognition:  Impaired,  Mild  Sleep:  Number of Hours: 7.5     Treatment Plan Summary:  24yo M with past psychiatric history of ADHD and medical history of gastritis, who was admitted to [facility] unit due to bizarre behavior in public place. Reportedly, he was acting bizarre in grocery store, was apparently arrested and police found drugs in his wallet. He became quite upset and made comments about hurting himself. UDS in ER reviewed patient positive for amphetamines, cocaine, benzodiazepines and marijuana. Today patient is calm, not agitated, more cooperative with the interview. He denies suicidal or homicidal thoughts, denies hallucinations, denies paranoid thoughts. He is still lethargic and is not ready to discuss his substance use. Denies physical complaints.  I spoke with patient`s mother, Mrs Barlowe [phone]. She reported that Samuel Contreras`s past psych history include ADHD, depression, anxiety and substance use disorder. He is on Adderall for ADHD, "some  med" for anxiety and both prescribed by PCP. He was on Wellbutrin for depression in the past. No history of past suicidal attempts. He had expressed suicidal thoughts just once in the past. No history of prior psych admissions. They found out about his substance use last August from his friend, They took him to a specialist and he had some outpatient therapy, not inpatient. Was on Suboxone for one week, then stopped, saying that he can deal with substance use without medication. Mother is not sure about drugs he uses currently. Parents are very concerned about his drug use and would like him to be referred to [facility].     EKG reviewed: sinus bradycardia 57bpm (today`s HR 82 bpm), otherwise normal EKG, QTc .     Impression: Substance-induced psychosis.  Plan: -continue inpatient psych admission; 15-minute checks; daily contact with patient to assess and evaluate symptoms and progress in treatment; psychoeducation -continue Seroquel 50mg  PO BID for psychosis; -continue agitation protocol with Geodon and Lorazepam  -continue Trazodone PRN sleep.  -continue PRN Hydroxyzine TID for anxiety. -Team will work on referrals to substance-use treatment. Parents would like him to be referred to [facility]   [facility], MD 03/04/2020, 11:48 AM

## 2020-03-04 NOTE — Plan of Care (Signed)
  Problem: Self-Concept: Goal: Ability to disclose and discuss suicidal ideas will improve Outcome: Not Progressing Goal: Will verbalize positive feelings about self Outcome: Not Progressing   Problem: Education: Goal: Knowledge of [facility] Education information/materials will improve Outcome: Not Progressing Goal: Emotional status will improve Outcome: Not Progressing Goal: Mental status will improve Outcome: Not Progressing Goal: Verbalization of understanding the information provided will improve Outcome: Not Progressing

## 2020-03-05 DIAGNOSIS — F1310 Sedative, hypnotic or anxiolytic abuse, uncomplicated: Secondary | ICD-10-CM

## 2020-03-05 DIAGNOSIS — F1110 Opioid abuse, uncomplicated: Secondary | ICD-10-CM

## 2020-03-05 LAB — PROLACTIN: Prolactin: 18.9 ng/mL — ABNORMAL HIGH (ref 4.0–15.2)

## 2020-03-05 MED ORDER — PANTOPRAZOLE SODIUM 40 MG PO TBEC
40.0000 mg | DELAYED_RELEASE_TABLET | Freq: Every day | ORAL | Status: DC
  Administered 2020-03-06: 40 mg via ORAL
  Filled 2020-03-05 (×2): qty 1

## 2020-03-05 NOTE — BHH Counselor (Signed)
Adult Comprehensive Assessment  Patient ID: Samuel Contreras, male   DOB: 11/20/1995, 24 y.o.   MRN: [id number]  Information Source: Information source: Patient  Current Stressors:  Patient states their primary concerns and needs for treatment are:: "I got arrested." Patient states their goals for this hospitilization and ongoing recovery are:: "stay out of trouble" Educational / Learning stressors: Pt denies. Employment / Job issues: Pt denies. Family Relationships: Pt denies. Financial / Lack of resources (include bankruptcy): Pt denies. Housing / Lack of housing: Pt denies. Physical health (include injuries & life threatening diseases): Pt denies. Social relationships: Pt denies. Substance abuse: "Heroin" Bereavement / Loss: Pt denies.  Living/Environment/Situation:  Living Arrangements: Parent Who else lives in the home?: "my parents" How long has patient lived in current situation?: "24 years" What is atmosphere in current home: Comfortable, Loving  Family History:  Marital status: Single What is your sexual orientation?: Heterosexual Does patient have children?: No  Childhood History:  By whom was/is the patient raised?: Both parents Description of patient's relationship with caregiver when they were a child: "good" Patient's description of current relationship with people who raised him/her: "good" How were you disciplined when you got in trouble as a child/adolescent?: "grounded" Does patient have siblings?: Yes Number of Siblings: 1 Description of patient's current relationship with siblings: "good" Did patient suffer any verbal/emotional/physical/sexual abuse as a child?: No Did patient suffer from severe childhood neglect?: No Has patient ever been sexually abused/assaulted/raped as an adolescent or adult?: No Was the patient ever a victim of a crime or a disaster?: No Witnessed domestic violence?: No Has patient been effected by domestic violence as an adult?:  No  Education:  Highest grade of school patient has completed: 12th Currently a student?: No Learning disability?: No  Employment/Work Situation:   Employment situation: Employed 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" How long has patient been employed?: "2 years" Patient's job has been impacted by current illness: No What is the longest time patient has a held a job?: "5 years" Where was the patient employed at that time?: "[employer]" Did You Receive Any Psychiatric Treatment/Services While in the Military?: No(NA) Are There Guns or Other Weapons in Your Home?: Yes Types of Guns/Weapons: "shotguns and rifles" Are These Weapons Safely Secured?: Yes(In gun safe)  Financial Resources:   Financial resources: Income from employment, Private insurance Does patient have a representative payee or guardian?: No  Alcohol/Substance Abuse:   What has been your use of drugs/alcohol within the last 12 months?: Heroin: "every day, 1/2 gram," last use "2 weeks ago" route of use: "snort" If attempted suicide, did drugs/alcohol play a role in this?: No Alcohol/Substance Abuse Treatment Hx: Denies past history Has alcohol/substance abuse ever caused legal problems?: Yes(Pt reports that he has court coming up for possession.)  Social Support System:   Patient's Community Support System: Good Describe Community Support System: "parents" Type of faith/religion: "Christian" How does patient's faith help to cope with current illness?: "pray"  Leisure/Recreation:   Leisure and Hobbies: "sports"  Strengths/Needs:   What is the patient's perception of their strengths?: "good leader" Patient states they can use these personal strengths during their treatment to contribute to their recovery: Pt denies. Patient states these barriers may affect/interfere with their treatment: Pt denies. Patient states these barriers may affect their return to the community: Pt denies.  Discharge Plan:   Currently  receiving community mental health services: No Patient states concerns and preferences for aftercare planning are: Pt reports that his  parents plan on taking him to a rehab center at discharge. Patient reports that he does not know the name. Patient states they will know when they are safe and ready for discharge when: "Now.  I can think more clearly". Does patient have access to transportation?: Yes Does patient have financial barriers related to discharge medications?: No Will patient be returning to same living situation after discharge?: Yes  Summary/Recommendations:   Summary and Recommendations (to be completed by the evaluator): Patient is a 24 year old [place] [facility] [place]   He presents to the hospital following odd and erratic behaviors at a local store.  Patient was reportedly walking down the aisles at the store eating and drinking some of the products.  He has a primary diagnosis of Substance Induced Psychotic Disorder with hallucinations.  Recommendations include: crisis stabilization, therapeutic milieu, encourage group attendance and participation, medication management for detox/mood stabilization and development of comprehensive mental wellness/sobriety plan.  Rozann Lesches. 03/05/2020

## 2020-03-05 NOTE — Plan of Care (Signed)
  Problem: Education: Goal: Knowledge of [facility] Education information/materials will improve Outcome: Progressing Goal: Emotional status will improve Outcome: Progressing Goal: Mental status will improve Outcome: Progressing Goal: Verbalization of understanding the information provided will improve Outcome: Progressing   Problem: Self-Concept: Goal: Ability to disclose and discuss suicidal ideas will improve Outcome: Progressing Goal: Will verbalize positive feelings about self Outcome: Progressing   

## 2020-03-05 NOTE — BHH Counselor (Signed)
Patient declined aftercare referral from this CSW stating that parents had obtained him a bed at a rehab facility.   Upon speaking with the patient's father, there is no bed placement.  He reports that he has spoken with [facility] but pt needs to call as well.  CSW will update the patient of this and support in identifying next steps.  Penni Homans, MSW, LCSW 03/05/2020 11:08 AM

## 2020-03-05 NOTE — Progress Notes (Signed)
Patient has been calm and cooperative. Denies SI, HI and AVH 

## 2020-03-05 NOTE — Plan of Care (Signed)
D- Patient alert and oriented. Patient presents in a pleasant mood on assessment stating that he slept ok last night and had no complaints or concerns to voice to this Clinical research associate. Patient denies SI, HI, AVH, and pain at this time. Patient also denies any signs/symptoms of depression/anxiety, reporting that overall, he is feeling "pretty good". Patient had no stated goals for today, however, he is fixated on being discharged.  A- Scheduled medications administered to patient, per MD orders. Support and encouragement provided.  Routine safety checks conducted every 15 minutes.  Patient informed to notify staff with problems or concerns.  R- No adverse drug reactions noted. Patient contracts for safety at this time. Patient compliant with medications and treatment plan. Patient receptive, calm, and cooperative. Patient interacts well with others on the unit.  Patient remains safe at this time.  Problem: Self-Concept: Goal: Ability to disclose and discuss suicidal ideas will improve Outcome: Progressing Goal: Will verbalize positive feelings about self Outcome: Progressing   Problem: Education: Goal: Knowledge of [facility] Education information/materials will improve Outcome: Progressing Goal: Emotional status will improve Outcome: Progressing Goal: Mental status will improve Outcome: Progressing Goal: Verbalization of understanding the information provided will improve Outcome: Progressing

## 2020-03-05 NOTE — Progress Notes (Signed)
[facility] MD Progress Note  03/05/2020 4:23 PM Samuel Contreras  MRN:  [id number] Subjective: Follow-up for this 24 year old man who is brought into the hospital after becoming agitated confused and probably delirious.  Patient has been sleeping much of the time since he came into the hospital.  On interview this evening he says he has only vague memories of being in [place] and being picked up by the police.  He knows that he has been continuing to use "heroin" several times a week and on this particular occasion also took some Xanax.  He denies other drug abuse.  It looks like his prescribed Adderall could account for the amphetamines although that still leaves the cocaine on accounted for.  In any case he says he is feeling much better today.  Does not report opiate withdrawal symptoms.  Does not report any hallucinations.  Mood feeling stable.  Denies suicidal or homicidal ideation. Principal Problem: Substance-induced psychotic disorder with hallucinations (HCC) Diagnosis: Principal Problem:   Substance-induced psychotic disorder with hallucinations (HCC) Active Problems:   Opiate abuse, continuous (HCC)   Benzodiazepine abuse (HCC)  Total Time spent with patient: 30 minutes  Past Psychiatric History: Patient says he has had substance abuse treatment in the past but has not had specific psychiatric treatment.  This sounds like it is not entirely true that he receives treatment for presumed ADHD with Adderall prescribed by his primary care doctor.  Denies any history of suicide attempts  Past Medical History: History reviewed. No pertinent past medical history. History reviewed. No pertinent surgical history. Family History: History reviewed. No pertinent family history. Family Psychiatric  History: None reported Social History:  Social History   Substance and Sexual Activity  Alcohol Use Yes     Social History   Substance and Sexual Activity  Drug Use Not on file   Comment: percocet     Social History   Socioeconomic History  . Marital status: Single    Spouse name: Not on file  . Number of children: Not on file  . Years of education: Not on file  . Highest education level: Not on file  Occupational History  . Not on file  Tobacco Use  . Smoking status: Current Every Day Smoker  . Smokeless tobacco: Never Used  Substance and Sexual Activity  . Alcohol use: Yes  . Drug use: Not on file    Comment: percocet  . Sexual activity: Not on file  Other Topics Concern  . Not on file  Social History Narrative  . Not on file   Social Determinants of Health   Financial Resource Strain:   . Difficulty of Paying Living Expenses:   Food Insecurity:   . Worried About Programme researcher, broadcasting/film/video in the Last Year:   . Barista in the Last Year:   Transportation Needs:   . Freight forwarder (Medical):   Marland Kitchen Lack of Transportation (Non-Medical):   Physical Activity:   . Days of Exercise per Week:   . Minutes of Exercise per Session:   Stress:   . Feeling of Stress :   Social Connections:   . Frequency of Communication with Friends and Family:   . Frequency of Social Gatherings with Friends and Family:   . Attends Religious Services:   . Active Member of Clubs or Organizations:   . Attends Banker Meetings:   Marland Kitchen Marital Status:    Additional Social History:  Sleep: Fair  Appetite:  Fair  Current Medications: Current Facility-Administered Medications  Medication Dose Route Frequency Provider Last Rate Last Admin  . acetaminophen (TYLENOL) tablet 650 mg  650 mg Oral Q6H PRN Oneta Rack, NP      . alum & mag hydroxide-simeth (MAALOX/MYLANTA) 200-200-20 MG/5ML suspension 30 mL  30 mL Oral Q4H PRN Oneta Rack, NP      . hydrOXYzine (ATARAX/VISTARIL) tablet 50 mg  50 mg Oral TID PRN Thalia Party, MD   50 mg at 03/04/20 0939  . magnesium hydroxide (MILK OF MAGNESIA) suspension 30 mL  30 mL Oral Daily PRN Oneta Rack, NP      . pantoprazole (PROTONIX) EC tablet 40 mg  40 mg Oral Daily Rayleigh Gillyard, Jackquline Denmark, MD        Lab Results:  Results for orders placed or performed during the hospital encounter of 03/03/20 (from the past 48 hour(s))  TSH     Status: Abnormal   Collection Time: 03/04/20  7:01 AM  Result Value Ref Range   TSH 0.330 (L) 0.350 - 4.500 uIU/mL    Comment: Performed by a 3rd Generation assay with a functional sensitivity of <=0.01 uIU/mL. Performed at [facility], [address] [place], [place] [address]   Prolactin     Status: Abnormal   Collection Time: 03/04/20  7:01 AM  Result Value Ref Range   Prolactin 18.9 (H) 4.0 - 15.2 ng/mL    Comment: (NOTE) Performed At: [facility] [address] [place], [place] [id number] Jolene Schimke MD AY:[id number]   Hemoglobin A1c     Status: None   Collection Time: 03/04/20  7:01 AM  Result Value Ref Range   Hgb A1c MFr Bld 5.0 4.8 - 5.6 %    Comment: (NOTE) Pre diabetes:          5.7%-6.4% Diabetes:              >6.4% Glycemic control for   <7.0% adults with diabetes    Mean Plasma Glucose 96.8 mg/dL    Comment: Performed at [facility], [address], [place], [place] [address]  Lipid panel     Status: None   Collection Time: 03/04/20  7:01 AM  Result Value Ref Range   Cholesterol 130 0 - 200 mg/dL   Triglycerides 59 <220 mg/dL   HDL 48 >25 mg/dL   Total CHOL/HDL Ratio 2.7 RATIO   VLDL 12 0 - 40 mg/dL   LDL Cholesterol 70 0 - 99 mg/dL    Comment:        Total Cholesterol/HDL:CHD Risk Coronary Heart Disease Risk Table                     Men   Women  1/2 Average Risk   3.4   3.3  Average Risk       5.0   4.4  2 X Average Risk   9.6   7.1  3 X Average Risk  23.4   11.0        Use the calculated Patient Ratio above and the CHD Risk Table to determine the patient's CHD Risk.        ATP III CLASSIFICATION (LDL):  <100     mg/dL   Optimal  427-062  mg/dL   Near or Above                     Optimal  130-159  mg/dL   Borderline  160-189  mg/dL   High  >190     mg/dL   Very High Performed at [facility], [address] [place], [place] [address]     Blood Alcohol level:  Lab Results  Component Value Date   [facility] <10 32/20/2542    Metabolic Disorder Labs: Lab Results  Component Value Date   HGBA1C 5.0 03/04/2020   MPG 96.8 03/04/2020   Lab Results  Component Value Date   PROLACTIN 18.9 (H) 03/04/2020   Lab Results  Component Value Date   CHOL 130 03/04/2020   TRIG 59 03/04/2020   HDL 48 03/04/2020   CHOLHDL 2.7 03/04/2020   VLDL 12 03/04/2020   LDLCALC 70 03/04/2020    Physical Findings: AIMS:  , ,  ,  ,    CIWA:    COWS:     Musculoskeletal: Strength & Muscle Tone: within normal limits Gait & Station: normal Patient leans: N/A  Psychiatric Specialty Exam: Physical Exam  Nursing note and vitals reviewed. Constitutional: He appears well-developed and well-nourished.  HENT:  Head: Normocephalic and atraumatic.  Eyes: Pupils are equal, round, and reactive to light. Conjunctivae are normal.  Cardiovascular: Regular rhythm and normal heart sounds.  Respiratory: Effort normal.  GI: Soft.  Musculoskeletal:        General: Normal range of motion.     Cervical back: Normal range of motion.  Neurological: He is alert.  Skin: Skin is warm and dry.  Psychiatric: He has a normal mood and affect. His speech is normal and behavior is normal. Judgment and thought content normal. Cognition and memory are normal.    Review of Systems  Constitutional: Negative.   HENT: Negative.   Eyes: Negative.   Respiratory: Negative.   Cardiovascular: Negative.   Gastrointestinal: Negative.   Musculoskeletal: Negative.   Skin: Negative.   Neurological: Negative.   Psychiatric/Behavioral: Negative.     Blood pressure (!) 147/93, pulse 82, temperature 98 F (36.7 C), temperature source Oral, resp. rate 16, height 6' (1.829 m), weight 102.5 kg, SpO2 100  %.Body mass index is 30.65 kg/m.  General Appearance: Disheveled  Eye Contact:  Fair  Speech:  Clear and Coherent  Volume:  Normal  Mood:  Euthymic  Affect:  Constricted  Thought Process:  Goal Directed  Orientation:  Full (Time, Place, and Person)  Thought Content:  Logical  Suicidal Thoughts:  No  Homicidal Thoughts:  No  Memory:  Immediate;   Fair Recent;   Fair Remote;   Fair  Judgement:  Fair  Insight:  Fair  Psychomotor Activity:  Normal  Concentration:  Concentration: Fair  Recall:  [employer] of Knowledge:  Fair  Language:  Fair  Akathisia:  No  Handed:  Right  AIMS (if indicated):     Assets:  Desire for Improvement Housing Physical Health Resilience Social Support  ADL's:  Intact  Cognition:  WNL  Sleep:  Number of Hours: 7     Treatment Plan Summary: Daily contact with patient to assess and evaluate symptoms and progress in treatment, Medication management and Plan Patient appears to have stabilized medically and psychiatrically.  No indication for specific psychiatric medicine.  Reviewed treatment options with him.  He and his family are already making arrangements for rehab treatment.  For now I am not going to restart the Adderall given the polysubstance abuse.  No other specific medication indicated.  We will probably be able to discharge him in  the morning.  I did send a letter to the clerk of courts letting them know that he was in the hospital.  Mordecai Rasmussen, MD 03/05/2020, 4:23 PM

## 2020-03-05 NOTE — [facility] Group Notes (Signed)
[facility] Group Notes:  (Nursing/MHT/Case Management/Adjunct)  Date:  03/05/2020  Time:  8:51 PM  Type of Therapy:  Group Therapy  Participation Level:  Active  Participation Quality:  Appropriate  Affect:  Appropriate  Cognitive:  Alert  Insight:  Good  Engagement in Group:  Engaged    Modes of Intervention:  Support  Summary of Progress/Problems:  Samuel Contreras 03/05/2020, 8:51 PM

## 2020-03-05 NOTE — [facility] Group Notes (Signed)
[facility] Group Notes:  (Nursing/MHT/Case Management/Adjunct)  Date:  03/05/2020  Time:  1:21 PM  Type of Therapy:  Psychoeducational Skills  Participation Level:  Did Not Attend    Landry Mellow 03/05/2020, 1:21 PM

## 2020-03-05 NOTE — [facility] Group Notes (Signed)
LCSW Group Therapy Note   03/05/2020 2:33 PM  Type of Therapy and Topic:  Group Therapy:  Overcoming Obstacles   Participation Level:  Did Not Attend   Description of Group:    In this group patients will be encouraged to explore what they see as obstacles to their own wellness and recovery. They will be guided to discuss their thoughts, feelings, and behaviors related to these obstacles. The group will process together ways to cope with barriers, with attention given to specific choices patients can make. Each patient will be challenged to identify changes they are motivated to make in order to overcome their obstacles. This group will be process-oriented, with patients participating in exploration of their own experiences as well as giving and receiving support and challenge from other group members.   Therapeutic Goals: 1. Patient will identify personal and current obstacles as they relate to admission. 2. Patient will identify barriers that currently interfere with their wellness or overcoming obstacles.  3. Patient will identify feelings, thought process and behaviors related to these barriers. 4. Patient will identify two changes they are willing to make to overcome these obstacles:      Summary of Patient Progress X   Therapeutic Modalities:   Cognitive Behavioral Therapy Solution Focused Therapy Motivational Interviewing Relapse Prevention Therapy  Penni Homans, MSW, LCSW 03/05/2020 2:33 PM

## 2020-03-05 NOTE — Progress Notes (Signed)
Recreation Therapy Notes  Date: 03/05/2020  Time: 9:30 am   Location: Craft room    Behavioral response: N/A   Intervention Topic: Necessities   Discussion/Intervention: Patient did not attend group.   Clinical Observations/Feedback:  Patient did not attend group.   Taquisha Phung LRT/CTRS        Shayna Eblen 03/05/2020 11:48 AM

## 2020-03-05 NOTE — [facility] Suicide Risk Assessment (Signed)
[facility] INPATIENT:  Family/Significant Other Suicide Prevention Education  Suicide Prevention Education:  Education Completed; River Ambrosio, father, [phone], has been identified by the patient as the family member/significant other with whom the patient will be residing, and identified as the person(s) who will aid the patient in the event of a mental health crisis (suicidal ideations/suicide attempt).  With written consent from the patient, the family member/significant other has been provided the following suicide prevention education, prior to the and/or following the discharge of the patient.  The suicide prevention education provided includes the following:  Suicide risk factors  Suicide prevention and interventions  National Suicide Hotline telephone number  [facility] assessment telephone number  [facility] Emergency Assistance 911  [facility] and/or Residential Mobile Crisis Unit telephone number  Request made of family/significant other to:  Remove weapons (e.g., guns, rifles, knives), all items previously/currently identified as safety concern.    Remove drugs/medications (over-the-counter, prescriptions, illicit drugs), all items previously/currently identified as a safety concern.  The family member/significant other verbalizes understanding of the suicide prevention education information provided.  The family member/significant other agrees to remove the items of safety concern listed above.  CSW spoke with the patient's father.  Patient's father reports that he received a call from an off duty officer who informed him that the patient was in the hospital.  He reports that "apparently he was acting bizarrely at the store and when he went to pull out his ID they saw he had drugs".  Father reports concern for the patient's wellbeing if continued drug use is not addressed.  Father reports that he has spoken to someone at [employer], however, bed is  not secured for the patient.   Harden Mo 03/05/2020, 10:58 AM

## 2020-03-05 NOTE — Progress Notes (Signed)
Patient stated that he does not want his scheduled Protonix right now, and he also states that he is not hungry, so he does not want to eat dinner.

## 2020-03-06 MED ORDER — PANTOPRAZOLE SODIUM 40 MG PO TBEC: 40.0000 mg | DELAYED_RELEASE_TABLET | Freq: Every day | ORAL | 1 refills | Status: AC

## 2020-03-06 MED ORDER — HYDROXYZINE HCL 50 MG PO TABS: 50.0000 mg | ORAL_TABLET | Freq: Three times a day (TID) | ORAL | 1 refills | Status: AC | PRN

## 2020-03-06 NOTE — Discharge Summary (Signed)
Physician Discharge Summary Note  Patient:  Samuel Contreras is an 24 y.o., male MRN:  [id number] DOB:  1995/11/28 Patient phone:  [phone] (home)  Patient address:   [address] [place] [address],  Total Time spent with patient: 30 minutes  Date of Admission:  03/03/2020 Date of Discharge: Mar 06, 2020  Reason for Admission: Admitted because of agitation confusion and bizarre behavior while intoxicated  Principal Problem: Substance-induced psychotic disorder with hallucinations [facility]) Discharge Diagnoses: Principal Problem:   Substance-induced psychotic disorder with hallucinations (HCC) Active Problems:   Opiate abuse, continuous (HCC)   Benzodiazepine abuse (HCC)   Past Psychiatric History: History of drug and alcohol abuse mostly substance abuse with opiates  Past Medical History: History reviewed. No pertinent past medical history. History reviewed. No pertinent surgical history. Family History: History reviewed. No pertinent family history. Family Psychiatric  History: See previous Social History:  Social History   Substance and Sexual Activity  Alcohol Use Yes     Social History   Substance and Sexual Activity  Drug Use Not on file   Comment: percocet    Social History   Socioeconomic History  . Marital status: Single    Spouse name: Not on file  . Number of children: Not on file  . Years of education: Not on file  . Highest education level: Not on file  Occupational History  . Not on file  Tobacco Use  . Smoking status: Current Every Day Smoker  . Smokeless tobacco: Never Used  Substance and Sexual Activity  . Alcohol use: Yes  . Drug use: Not on file    Comment: percocet  . Sexual activity: Not on file  Other Topics Concern  . Not on file  Social History Narrative  . Not on file   Social Determinants of Health   Financial Resource Strain:   . Difficulty of Paying Living Expenses:   Food Insecurity:   . Worried About Patent examiner in the Last Year:   . Barista in the Last Year:   Transportation Needs:   . Freight forwarder (Medical):   Marland Kitchen Lack of Transportation (Non-Medical):   Physical Activity:   . Days of Exercise per Week:   . Minutes of Exercise per Session:   Stress:   . Feeling of Stress :   Social Connections:   . Frequency of Communication with Friends and Family:   . Frequency of Social Gatherings with Friends and Family:   . Attends Religious Services:   . Active Member of Clubs or Organizations:   . Attends Banker Meetings:   Marland Kitchen Marital Status:     Hospital Course: Admitted to psychiatric unit.  15-minute checks maintained.  No dangerous or aggressive behavior identified.  Denied any suicidal ideation.  Denied psychotic symptoms.  Patient was cooperative with treatment and did not require any specific detox.  All psychotic symptoms have been resolved.  He has agreed with the need for appropriate outpatient substance abuse treatment.  Calm and appears to be safe at discharge  Physical Findings: AIMS:  , ,  ,  ,    CIWA:    COWS:     Musculoskeletal: Strength & Muscle Tone: within normal limits Gait & Station: normal Patient leans: N/A  Psychiatric Specialty Exam: Physical Exam  Nursing note and vitals reviewed. Constitutional: He appears well-developed and well-nourished.  HENT:  Head: Normocephalic and atraumatic.  Eyes: Pupils are equal, round, and  reactive to light. Conjunctivae are normal.  Cardiovascular: Regular rhythm and normal heart sounds.  Respiratory: Effort normal. No respiratory distress.  GI: Soft.  Musculoskeletal:        General: Normal range of motion.     Cervical back: Normal range of motion.  Neurological: He is alert.  Skin: Skin is warm and dry.  Psychiatric: He has a normal mood and affect. His behavior is normal. Judgment and thought content normal.    Review of Systems  Constitutional: Negative.   HENT: Negative.   Eyes:  Negative.   Respiratory: Negative.   Cardiovascular: Negative.   Gastrointestinal: Negative.   Musculoskeletal: Negative.   Skin: Negative.   Neurological: Negative.   Psychiatric/Behavioral: Negative.     Blood pressure 133/77, pulse 80, temperature 98.1 F (36.7 C), temperature source Oral, resp. rate 18, height 6' (1.829 m), weight 102.5 kg, SpO2 99 %.Body mass index is 30.65 kg/m.  General Appearance: Casual  Eye Contact:  Good  Speech:  Clear and Coherent  Volume:  Normal  Mood:  Euthymic  Affect:  Congruent  Thought Process:  Goal Directed  Orientation:  Full (Time, Place, and Person)  Thought Content:  Logical  Suicidal Thoughts:  No  Homicidal Thoughts:  No  Memory:  Immediate;   Fair Recent;   Fair Remote;   Fair  Judgement:  Fair  Insight:  Fair  Psychomotor Activity:  Normal  Concentration:  Concentration: Fair  Recall:  [place] of Knowledge:  Fair  Language:  Fair  Akathisia:  No  Handed:  Right  AIMS (if indicated):     Assets:  Desire for Improvement  ADL's:  Intact  Cognition:  WNL  Sleep:  Number of Hours: 6.75     Have you used any form of tobacco in the last 30 days? (Cigarettes, Smokeless Tobacco, Cigars, and/or Pipes): Yes  Has this patient used any form of tobacco in the last 30 days? (Cigarettes, Smokeless Tobacco, Cigars, and/or Pipes) Yes, Yes, A prescription for an FDA-approved tobacco cessation medication was offered at discharge and the patient refused  Blood Alcohol level:  Lab Results  Component Value Date   [facility] <10 93/71/6967    Metabolic Disorder Labs:  Lab Results  Component Value Date   HGBA1C 5.0 03/04/2020   MPG 96.8 03/04/2020   Lab Results  Component Value Date   PROLACTIN 18.9 (H) 03/04/2020   Lab Results  Component Value Date   CHOL 130 03/04/2020   TRIG 59 03/04/2020   HDL 48 03/04/2020   CHOLHDL 2.7 03/04/2020   VLDL 12 03/04/2020   [place] 70 03/04/2020    See Psychiatric Specialty Exam and Suicide  Risk Assessment completed by Attending Physician prior to discharge.  Discharge destination:  Home  Is patient on multiple antipsychotic therapies at discharge:  No   Has Patient had three or more failed trials of antipsychotic monotherapy by history:  No  Recommended Plan for Multiple Antipsychotic Therapies: NA  Discharge Instructions    Diet - low sodium heart healthy   Complete by: As directed    Increase activity slowly   Complete by: As directed      Allergies as of 03/06/2020   No Known Allergies     Medication List    STOP taking these medications   amphetamine-dextroamphetamine 10 MG tablet Commonly known as: ADDERALL     TAKE these medications     Indication  hydrOXYzine 50 MG tablet Commonly known as: ATARAX/VISTARIL Take 1  tablet (50 mg total) by mouth 3 (three) times daily as needed for anxiety. What changed:   medication strength  how much to take  when to take this  reasons to take this  Indication: Feeling Anxious   pantoprazole 40 MG tablet Commonly known as: PROTONIX Take 1 tablet (40 mg total) by mouth daily. Start taking on: Mar 07, 2020  Indication: Gastroesophageal Reflux Disease        Follow-up recommendations:  Activity:  Activity as tolerated Diet:  Regular diet Other:  Recommend follow-up outpatient treatment  Comments: Recommend outpatient follow-up treatment for substance abuse  Signed: Mordecai Rasmussen, MD 03/06/2020, 9:31 AM

## 2020-03-06 NOTE — Progress Notes (Signed)
Discharge Note:  The patient was discharged with transportation provided by his family.  Documents were reviewed, paper scripts were provided, and the patient verified his belongings.  A cell phone was unable to be located, but the patient is under the belief that he left the phone at [facility].  Mood was stable and bright.  The patient was future oriented and had no questions for the writer.

## 2020-03-06 NOTE — Tx Team (Signed)
Interdisciplinary Treatment and Diagnostic Plan Update  03/06/2020 Time of Session: 9:00AM Samuel Contreras MRN: [id number]  Principal Diagnosis: Substance-induced psychotic disorder with hallucinations ([place])  Secondary Diagnoses: Principal Problem:   Substance-induced psychotic disorder with hallucinations ([place]) Active Problems:   Opiate abuse, continuous ([place])   Benzodiazepine abuse ([place])   Current Medications:  No current facility-administered medications for this encounter.   Current Outpatient Medications  Medication Sig Dispense Refill  . hydrOXYzine (ATARAX/VISTARIL) 50 MG tablet Take 1 tablet (50 mg total) by mouth 3 (three) times daily as needed for anxiety. 30 tablet 1  . [START ON 03/07/2020] pantoprazole (PROTONIX) 40 MG tablet Take 1 tablet (40 mg total) by mouth daily. 30 tablet 1   PTA Medications: No medications prior to admission.    Patient Stressors: Financial difficulties Occupational concerns Substance abuse  Patient Strengths: Ability for insight Active sense of humor Average or above average intelligence  Treatment Modalities: Medication Management, Group therapy, Case management,  1 to 1 session with clinician, Psychoeducation, Recreational therapy.   Physician Treatment Plan for Primary Diagnosis: Substance-induced psychotic disorder with hallucinations ([place]) Long Term Goal(s): Improvement in symptoms so as ready for discharge Improvement in symptoms so as ready for discharge   Short Term Goals: Ability to identify changes in lifestyle to reduce recurrence of condition will improve Ability to verbalize feelings will improve Ability to disclose and discuss suicidal ideas Ability to demonstrate self-control will improve Ability to identify and develop effective coping behaviors will improve Ability to maintain clinical measurements within normal limits will improve Compliance with prescribed medications will improve Ability to identify triggers  associated with substance abuse/mental health issues will improve Ability to identify changes in lifestyle to reduce recurrence of condition will improve Ability to verbalize feelings will improve Ability to disclose and discuss suicidal ideas Ability to demonstrate self-control will improve Ability to identify and develop effective coping behaviors will improve Ability to maintain clinical measurements within normal limits will improve Compliance with prescribed medications will improve Ability to identify triggers associated with substance abuse/mental health issues will improve  Medication Management: Evaluate patient's response, side effects, and tolerance of medication regimen.  Therapeutic Interventions: 1 to 1 sessions, Unit Group sessions and Medication administration.  Evaluation of Outcomes: Adequate for Discharge  Physician Treatment Plan for Secondary Diagnosis: Principal Problem:   Substance-induced psychotic disorder with hallucinations ([place]) Active Problems:   Opiate abuse, continuous ([place])   Benzodiazepine abuse ([place])  Long Term Goal(s): Improvement in symptoms so as ready for discharge Improvement in symptoms so as ready for discharge   Short Term Goals: Ability to identify changes in lifestyle to reduce recurrence of condition will improve Ability to verbalize feelings will improve Ability to disclose and discuss suicidal ideas Ability to demonstrate self-control will improve Ability to identify and develop effective coping behaviors will improve Ability to maintain clinical measurements within normal limits will improve Compliance with prescribed medications will improve Ability to identify triggers associated with substance abuse/mental health issues will improve Ability to identify changes in lifestyle to reduce recurrence of condition will improve Ability to verbalize feelings will improve Ability to disclose and discuss suicidal ideas Ability to demonstrate  self-control will improve Ability to identify and develop effective coping behaviors will improve Ability to maintain clinical measurements within normal limits will improve Compliance with prescribed medications will improve Ability to identify triggers associated with substance abuse/mental health issues will improve     Medication Management: Evaluate patient's response, side effects, and tolerance of medication regimen.  Therapeutic Interventions: 1 to 1 sessions, Unit Group sessions and Medication administration.  Evaluation of Outcomes: Adequate for Discharge   RN Treatment Plan for Primary Diagnosis: Substance-induced psychotic disorder with hallucinations (HCC) Long Term Goal(s): Knowledge of disease and therapeutic regimen to maintain health will improve  Short Term Goals: Ability to demonstrate self-control, Ability to participate in decision making will improve, Ability to verbalize feelings will improve, Ability to disclose and discuss suicidal ideas, Ability to identify and develop effective coping behaviors will improve and Compliance with prescribed medications will improve  Medication Management: RN will administer medications as ordered by provider, will assess and evaluate patient's response and provide education to patient for prescribed medication. RN will report any adverse and/or side effects to prescribing provider.  Therapeutic Interventions: 1 on 1 counseling sessions, Psychoeducation, Medication administration, Evaluate responses to treatment, Monitor vital signs and CBGs as ordered, Perform/monitor CIWA, COWS, AIMS and Fall Risk screenings as ordered, Perform wound care treatments as ordered.  Evaluation of Outcomes: Adequate for Discharge   LCSW Treatment Plan for Primary Diagnosis: Substance-induced psychotic disorder with hallucinations (HCC) Long Term Goal(s): Safe transition to appropriate next level of care at discharge, Engage patient in therapeutic group  addressing interpersonal concerns.  Short Term Goals: Engage patient in aftercare planning with referrals and resources, Increase social support, Increase ability to appropriately verbalize feelings, Increase emotional regulation, Facilitate acceptance of mental health diagnosis and concerns and Increase skills for wellness and recovery  Therapeutic Interventions: Assess for all discharge needs, 1 to 1 time with Social worker, Explore available resources and support systems, Assess for adequacy in community support network, Educate family and significant other(s) on suicide prevention, Complete Psychosocial Assessment, Interpersonal group therapy.  Evaluation of Outcomes: Adequate for Discharge   Progress in Treatment: Attending groups: No. Participating in groups: No. Taking medication as prescribed: Yes. Toleration medication: Yes. Family/Significant other contact made: Yes, individual(s) contacted:  SPE completed with pt and pt's father Patient understands diagnosis: Yes. Discussing patient identified problems/goals with staff: Yes. Medical problems stabilized or resolved: Yes. Denies suicidal/homicidal ideation: Yes. Issues/concerns per patient self-inventory: No. Other: none  New problem(s) identified: No, Describe:  none  New Short Term/Long Term Goal(s): detox, elimination of symptoms of psychosis, medication management for mood stabilization; elimination of SI thoughts; development of comprehensive mental wellness/sobriety plan.  Patient Goals:  "get my mind back right"  Discharge Plan or Barriers: Patient reports plans to pursue treatment recommended by his parents. Patient also requested that [facility] referral be sent despite being discharged.  CSW will follow up as needed on the [facility] information.   Reason for Continuation of Hospitalization: Anxiety Depression Medication stabilization  Estimated Length of Stay: 1-7 days  Attendees: Patient: Samuel Contreras 03/06/2020 3:33 PM   Physician: Dr. Toni Amend, MD 03/06/2020 3:33 PM  Nursing: Torrie Mayers, RN 03/06/2020 3:33 PM  RN Care Manager: 03/06/2020 3:33 PM  Social Worker: Penni Homans, LCSW  03/06/2020 3:33 PM  Recreational Therapist: Hilbert Bible, LRT  03/06/2020 3:33 PM  Other: Lowella Dandy, LCSW 03/06/2020 3:33 PM  Other:  03/06/2020 3:33 PM  Other: 03/06/2020 3:33 PM    Scribe for Treatment Team: Harden Mo, LCSW 03/06/2020 3:33 PM

## 2020-03-06 NOTE — Progress Notes (Signed)
  [facility] Adult Case Management Discharge Plan :  Will you be returning to the same living situation after discharge:  Yes,  pt reports he is returning home.  At discharge, do you have transportation home?: Yes,  pt reports father will provide transportation.  Do you have the ability to pay for your medications: Yes,  [employer]  Release of information consent forms completed and in the chart;  Patient's signature needed at discharge.  Patient to Follow up at: Follow-up Information    [facility].   Why: Referral has been sent.  Please follow up for acceptance and bed availability.  Thanks! Contact information: [address] [place] [address] [phone]           Next level of care provider has access to [facility] Link:no  Safety Planning and Suicide Prevention discussed: Yes,  SPE completed with the patient's father.   Have you used any form of tobacco in the last 30 days? (Cigarettes, Smokeless Tobacco, Cigars, and/or Pipes): Yes  Has patient been referred to the Quitline?: Patient refused referral  Patient has been referred for addiction treatment: Yes  Harden Mo, LCSW 03/06/2020, 10:12 AM

## 2020-03-06 NOTE — [facility] Suicide Risk Assessment (Signed)
[facility] Discharge Suicide Risk Assessment   Principal Problem: Substance-induced psychotic disorder with hallucinations (HCC) Discharge Diagnoses: Principal Problem:   Substance-induced psychotic disorder with hallucinations (HCC) Active Problems:   Opiate abuse, continuous (HCC)   Benzodiazepine abuse (HCC)   Total Time spent with patient: 30 minutes  Musculoskeletal: Strength & Muscle Tone: within normal limits Gait & Station: normal Patient leans: N/A  Psychiatric Specialty Exam: Review of Systems  Constitutional: Negative.   HENT: Negative.   Eyes: Negative.   Respiratory: Negative.   Cardiovascular: Negative.   Gastrointestinal: Negative.   Musculoskeletal: Negative.   Skin: Negative.   Neurological: Negative.   Psychiatric/Behavioral: Negative.     Blood pressure 133/77, pulse 80, temperature 98.1 F (36.7 C), temperature source Oral, resp. rate 18, height 6' (1.829 m), weight 102.5 kg, SpO2 99 %.Body mass index is 30.65 kg/m.  General Appearance: Casual  Eye Contact::  Good  Speech:  Clear and Coherent409  Volume:  Normal  Mood:  Euthymic  Affect:  Constricted  Thought Process:  Goal Directed  Orientation:  Full (Time, Place, and Person)  Thought Content:  Logical  Suicidal Thoughts:  No  Homicidal Thoughts:  No  Memory:  Immediate;   Fair Recent;   Fair Remote;   Fair  Judgement:  Fair  Insight:  Fair  Psychomotor Activity:  Normal  Concentration:  Fair  Recall:  [employer] of Knowledge:Fair  Language: Fair  Akathisia:  No  Handed:  Right  AIMS (if indicated):     Assets:  Desire for Improvement Housing Physical Health  Sleep:  Number of Hours: 6.75  Cognition: WNL  ADL's:  Intact   Mental Status Per Nursing Assessment::   On Admission:  NA  Demographic Factors:  Male, Caucasian and Low socioeconomic status  Loss Factors: Financial problems/change in socioeconomic status  Historical Factors: NA  Risk Reduction Factors:   Positive  social support  Continued Clinical Symptoms:  Alcohol/Substance Abuse/Dependencies  Cognitive Features That Contribute To Risk:  Loss of executive function    Suicide Risk:  Minimal: No identifiable suicidal ideation.  Patients presenting with no risk factors but with morbid ruminations; may be classified as minimal risk based on the severity of the depressive symptoms    Plan Of Care/Follow-up recommendations:  Activity:  Activity as tolerated Diet:  Regular diet Other:  Follow-up with outpatient treatment  Mordecai Rasmussen, MD 03/06/2020, 9:28 AM

## 2020-03-06 NOTE — Plan of Care (Signed)
  Problem: Self-Concept: Goal: Ability to disclose and discuss suicidal ideas will improve Outcome: Progressing Goal: Will verbalize positive feelings about self Outcome: Progressing   Problem: Education: Goal: Knowledge of [facility] Education information/materials will improve Outcome: Progressing Goal: Emotional status will improve Outcome: Progressing Goal: Mental status will improve Outcome: Progressing Goal: Verbalization of understanding the information provided will improve Outcome: Progressing

## 2020-03-06 NOTE — Progress Notes (Signed)
Patient has been calm and cooperative on the unit. Denies SI, HI and AVH. Talked about what brought patient into the hospital and educated patient about the consequences of his drug use.

## 2020-11-21 ENCOUNTER — Ambulatory Visit
Admission: RE | Admit: 2020-11-21 | Discharge: 2020-11-21 | Disposition: A | Discharge status: Home / Self Care | Payer: BC Managed Care – PPO | Source: Ambulatory Visit | Attending: Family Medicine | Admitting: Family Medicine

## 2020-11-21 ENCOUNTER — Other Ambulatory Visit: Payer: Self-pay | Admitting: Family Medicine

## 2020-11-21 ENCOUNTER — Other Ambulatory Visit: Payer: Self-pay

## 2020-11-21 DIAGNOSIS — R0781 Pleurodynia: Secondary | ICD-10-CM

## 2020-12-21 ENCOUNTER — Other Ambulatory Visit: Payer: Self-pay | Admitting: Gastroenterology

## 2020-12-21 ENCOUNTER — Other Ambulatory Visit (HOSPITAL_COMMUNITY): Payer: Self-pay | Admitting: Gastroenterology

## 2020-12-21 DIAGNOSIS — B1920 Unspecified viral hepatitis C without hepatic coma: Secondary | ICD-10-CM

## 2020-12-28 ENCOUNTER — Other Ambulatory Visit: Payer: Self-pay

## 2020-12-28 ENCOUNTER — Ambulatory Visit
Admission: RE | Admit: 2020-12-28 | Discharge: 2020-12-28 | Disposition: A | Discharge status: Home / Self Care | Payer: BC Managed Care – PPO | Source: Ambulatory Visit | Attending: Gastroenterology | Admitting: Gastroenterology

## 2020-12-28 DIAGNOSIS — B1920 Unspecified viral hepatitis C without hepatic coma: Secondary | ICD-10-CM | POA: Diagnosis not present

## 2021-01-13 ENCOUNTER — Other Ambulatory Visit: Payer: Self-pay

## 2021-01-13 ENCOUNTER — Emergency Department
Admission: EM | Admit: 2021-01-13 | Discharge: 2021-01-13 | Disposition: A | Discharge status: Home / Self Care | Payer: BC Managed Care – PPO | Attending: Emergency Medicine | Admitting: Emergency Medicine

## 2021-01-13 DIAGNOSIS — F17210 Nicotine dependence, cigarettes, uncomplicated: Secondary | ICD-10-CM | POA: Diagnosis not present

## 2021-01-13 DIAGNOSIS — R4020 Unspecified coma: Secondary | ICD-10-CM | POA: Diagnosis not present

## 2021-01-13 DIAGNOSIS — F1010 Alcohol abuse, uncomplicated: Secondary | ICD-10-CM | POA: Diagnosis not present

## 2021-01-13 DIAGNOSIS — R4189 Other symptoms and signs involving cognitive functions and awareness: Secondary | ICD-10-CM

## 2021-01-13 LAB — URINE DRUG SCREEN, QUALITATIVE (ARMC ONLY)
Amphetamines, Ur Screen: NOT DETECTED
Barbiturates, Ur Screen: NOT DETECTED
Benzodiazepine, Ur Scrn: NOT DETECTED
Cannabinoid 50 Ng, Ur ~~LOC~~: NOT DETECTED
Cocaine Metabolite,Ur ~~LOC~~: NOT DETECTED
MDMA (Ecstasy)Ur Screen: NOT DETECTED
Methadone Scn, Ur: NOT DETECTED
Opiate, Ur Screen: NOT DETECTED
Phencyclidine (PCP) Ur S: NOT DETECTED
Tricyclic, Ur Screen: NOT DETECTED

## 2021-01-13 LAB — CBC
HCT: 48.9 % (ref 39.0–52.0)
Hemoglobin: 16.2 g/dL (ref 13.0–17.0)
MCH: 31.5 pg (ref 26.0–34.0)
MCHC: 33.1 g/dL (ref 30.0–36.0)
MCV: 95.1 fL (ref 80.0–100.0)
Platelets: 166 10*3/uL (ref 150–400)
RBC: 5.14 MIL/uL (ref 4.22–5.81)
RDW: 12.3 % (ref 11.5–15.5)
WBC: 5.8 10*3/uL (ref 4.0–10.5)
nRBC: 0 % (ref 0.0–0.2)

## 2021-01-13 LAB — COMPREHENSIVE METABOLIC PANEL
ALT: 51 U/L — ABNORMAL HIGH (ref 0–44)
AST: 37 U/L (ref 15–41)
Albumin: 4.4 g/dL (ref 3.5–5.0)
Alkaline Phosphatase: 65 U/L (ref 38–126)
Anion gap: 9 (ref 5–15)
BUN: 20 mg/dL (ref 6–20)
CO2: 27 mmol/L (ref 22–32)
Calcium: 9.1 mg/dL (ref 8.9–10.3)
Chloride: 106 mmol/L (ref 98–111)
Creatinine, Ser: 1.16 mg/dL (ref 0.61–1.24)
GFR, Estimated: >60 mL/min (ref >60)
Glucose, Bld: 116 mg/dL — ABNORMAL HIGH (ref 70–99)
Potassium: 4.5 mmol/L (ref 3.5–5.1)
Sodium: 142 mmol/L (ref 135–145)
Total Bilirubin: 0.5 mg/dL (ref 0.3–1.2)
Total Protein: 7.6 g/dL (ref 6.5–8.1)

## 2021-01-13 LAB — ETHANOL: Alcohol, Ethyl (B): 21 mg/dL — ABNORMAL HIGH (ref <10)

## 2021-01-13 LAB — SALICYLATE LEVEL: Salicylate Lvl: <7 mg/dL — ABNORMAL LOW (ref 7.0–30.0)

## 2021-01-13 LAB — ACETAMINOPHEN LEVEL: Acetaminophen (Tylenol), Serum: <10 ug/mL — ABNORMAL LOW (ref 10–30)

## 2021-01-13 MED ORDER — SODIUM CHLORIDE 0.9 % IV BOLUS
1000.0000 mL | Freq: Once | INTRAVENOUS | Status: AC
  Administered 2021-01-13: 1000 mL via INTRAVENOUS

## 2021-01-13 MED ORDER — ONDANSETRON HCL 4 MG/2ML IJ SOLN
4.0000 mg | Freq: Once | INTRAMUSCULAR | Status: DC
  Filled 2021-01-13: qty 2

## 2021-01-13 NOTE — ED Notes (Signed)
Patient assisted to bathroom, urine sample obtained. Sample sent to lab.

## 2021-01-13 NOTE — ED Notes (Signed)
Gave patient urine cup, instructed to get a sample when he uses the bathroom. Patient states understanding.

## 2021-01-13 NOTE — ED Triage Notes (Signed)
Patient arrives from home via [facility] with c/o possible overdose. Patient found unresponsive by family, given CPR instructions. Given 2 narcan at approximately 0650, patient responded to narcan, denies drug use. Patient states all he had today was his prescribed Wellbutrin and alcohol this evening. Patient has no recollection of LOC.   Patient A&O, ambulatory at this time. NAD noted.

## 2021-01-13 NOTE — ED Provider Notes (Signed)
[facility] Emergency Department Provider Note  Time seen: 7:50 PM  I have reviewed the triage vital signs and the nursing notes.   HISTORY  Chief Complaint Drug Overdose   HPI Samuel Contreras is a 25 y.o. male with a past medical history of opiate abuse, benzodiazepine abuse, presents to the emergency department for unresponsiveness.  According to report patient was found down by family CPR was started and EMS was called.  EMS states the patient was unresponsive upon arrival but after Narcan administration awoke and is now awake alert oriented.  Upon arrival to the emergency department patient is awake alert oriented, no distress, has no complaints.  Patient states he took his Wellbutrin tonight, normal amount smoked a cigarette and drink alcohol but denies any opiate use.  States he has a history of using opiates including snorting opiates in the past but states he was not doing that tonight.  Patient is currently awake alert oriented he has no complaints.  Patient specifically denies any thoughts or intention of hurting himself.  History reviewed. No pertinent past medical history.  Patient Active Problem List   Diagnosis Date Noted  . Opiate abuse, continuous (HCC) 03/05/2020  . 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 No pertinent surgical history.  Prior to Admission medications   Medication Sig Start Date End Date Taking? Authorizing Provider  hydrOXYzine (ATARAX/VISTARIL) 50 MG tablet Take 1 tablet (50 mg total) by mouth 3 (three) times daily as needed for anxiety. 03/06/20   Clapacs, Jackquline Denmark, MD  pantoprazole (PROTONIX) 40 MG tablet Take 1 tablet (40 mg total) by mouth daily. 03/07/20   Clapacs, Jackquline Denmark, MD    No Known Allergies  History reviewed. No pertinent family history.  Social History Social History   Tobacco Use  . Smoking status: Current Every Day  Smoker    Packs/day: 0.50    Types: Cigarettes  . Smokeless tobacco: Never Used  Substance Use Topics  . Alcohol use: Yes  . Drug use: Not Currently    Comment: percocet    Review of Systems Constitutional: Negative for fever. Cardiovascular: Negative for chest pain. Respiratory: Negative for shortness of breath. Gastrointestinal: Negative for abdominal pain, vomiting  Musculoskeletal: Negative for musculoskeletal complaints Neurological: Negative for headache All other ROS negative  ____________________________________________   PHYSICAL EXAM:  VITAL SIGNS: ED Triage Vitals  Enc Vitals Group     BP 01/13/21 1937 130/81     Pulse Rate 01/13/21 1937 (!) 109     Resp 01/13/21 1937 19     Temp 01/13/21 1937 98.4 F (36.9 C)     Temp Source 01/13/21 1937 Oral     SpO2 01/13/21 1937 94 %     Weight 01/13/21 1934 265 lb (120.2 kg)     Height 01/13/21 1934 6' (1.829 m)     Head Circumference --      Peak Flow --      Pain Score 01/13/21 1934 0     Pain Loc --      Pain Edu? --      Excl. in GC? --     Constitutional: Alert and oriented. Well appearing and in no distress. Eyes: Normal exam ENT      Head: Normocephalic and atraumatic.      Mouth/Throat: Mucous membranes are moist. Cardiovascular: Normal rate, regular rhythm. Respiratory: Normal respiratory effort without tachypnea nor retractions. Breath sounds are clear  Gastrointestinal: Soft and nontender. No distention.  Musculoskeletal: Nontender with normal range of motion in all extremities. Neurologic:  Normal speech and language. No gross focal neurologic deficits Skin:  Skin is warm, dry and intact.  Psychiatric: Mood and affect are normal.  ____________________________________________    EKG  EKG viewed and interpreted by myself shows sinus tachycardia at 108 bpm with a narrow QRS, normal axis, normal intervals, nonspecific ST changes.  ____________________________________________   INITIAL  IMPRESSION / ASSESSMENT AND PLAN / ED COURSE  Pertinent labs & imaging results that were available during my care of the patient were reviewed by me and considered in my medical decision making (see chart for details).   Patient presents to the emergency department after unresponsive episode/apparent overdose.  Patient not very forthcoming with any substance use, admits to drinking beer and smoking a cigarette.  EMS states clear response to Narcan and patient is currently awake alert and oriented with no complaints.  We will check labs, IV hydrate, continue to closely monitor to ensure as the Narcan wears off the patient does not have any further issues.  Patient agreeable to plan of care  Patient later admits and privacy the use of fentanyl tonight.  States he has been clean for 6 months and relapsed tonight.  States it was a wake-up call and he will not be using any more opioids.  Patient's lab work is reassuring, alcohol of 21 urine drug screen results negative.  Samuel Contreras was evaluated in Emergency Department on 01/13/2021 for the symptoms described in the history of present illness. He was evaluated in the context of the global COVID-19 pandemic, which necessitated consideration that the patient might be at risk for infection with the SARS-CoV-2 virus that causes COVID-19. Institutional protocols and algorithms that pertain to the evaluation of patients at risk for COVID-19 are in a state of rapid change based on information released by regulatory bodies including the CDC and federal and state organizations. These policies and algorithms were followed during the patient's care in the ED.  ____________________________________________   FINAL CLINICAL IMPRESSION(S) / ED DIAGNOSES  Unresponsiveness   Minna Antis, MD 01/13/21 2312

## 2021-01-13 NOTE — ED Notes (Signed)
Mother, Erasto Sleight, [phone], leaving att request for call for pick up prior to DC so she can make sure pt leaves safely

## 2021-01-13 NOTE — ED Notes (Signed)
Mother outside of room requesting to speak with EDP.

## 2021-01-14 ENCOUNTER — Emergency Department
Admission: EM | Admit: 2021-01-14 | Discharge: 2021-01-14 | Disposition: A | Discharge status: Home / Self Care | Payer: BC Managed Care – PPO | Attending: Emergency Medicine | Admitting: Emergency Medicine

## 2021-01-14 DIAGNOSIS — F1010 Alcohol abuse, uncomplicated: Secondary | ICD-10-CM | POA: Diagnosis not present

## 2021-01-14 DIAGNOSIS — F17210 Nicotine dependence, cigarettes, uncomplicated: Secondary | ICD-10-CM | POA: Insufficient documentation

## 2021-01-14 DIAGNOSIS — R259 Unspecified abnormal involuntary movements: Secondary | ICD-10-CM | POA: Insufficient documentation

## 2021-01-14 DIAGNOSIS — F1020 Alcohol dependence, uncomplicated: Secondary | ICD-10-CM | POA: Insufficient documentation

## 2021-01-14 DIAGNOSIS — F1910 Other psychoactive substance abuse, uncomplicated: Secondary | ICD-10-CM

## 2021-01-14 DIAGNOSIS — Z046 Encounter for general psychiatric examination, requested by authority: Secondary | ICD-10-CM | POA: Diagnosis present

## 2021-01-14 MED ORDER — NICOTINE 21 MG/24HR TD PT24
21.0000 mg | MEDICATED_PATCH | Freq: Once | TRANSDERMAL | Status: DC
  Administered 2021-01-14: 21 mg via TRANSDERMAL
  Filled 2021-01-14: qty 1

## 2021-01-14 MED ORDER — TRAZODONE HCL 50 MG PO TABS
50.0000 mg | ORAL_TABLET | Freq: Once | ORAL | Status: AC
  Administered 2021-01-14: 50 mg via ORAL
  Filled 2021-01-14: qty 1

## 2021-01-14 MED ORDER — ACETAMINOPHEN 500 MG PO TABS
1000.0000 mg | ORAL_TABLET | Freq: Four times a day (QID) | ORAL | Status: DC | PRN
  Administered 2021-01-14: 1000 mg via ORAL
  Filled 2021-01-14: qty 2

## 2021-01-14 NOTE — Consult Note (Signed)
[facility] Face-to-Face Psychiatry Consult   Reason for Consult: Consult for 25 year old man under IVC by parents who suspected opiate overdose Referring Physician: Su Hoff Patient Identification: Samuel Contreras MRN:  [id number] Principal Diagnosis: Alcohol abuse Diagnosis:  Principal Problem:   Alcohol abuse   Total Time spent with patient: 45 minutes  Subjective:   Samuel Contreras is a 25 y.o. male patient admitted with "I was just drinking and fell out".  HPI: Patient seen chart reviewed.  Patient's parents took out IVC papers on him to prevent discharge thinking that he had been abusing opiates.  Patient states this is not true that he is not using any narcotics recently.  Says that yesterday he had been drinking and consumed half a bottle of whiskey which is unusual for him.  This is his explanation for why he was passed out.  He denies that he is been using any other drugs.  Reports that his mood is been fine.  Not depressed.  Denies suicidal or homicidal ideation.  Denies psychosis.  Past Psychiatric History: History of substance abuse with opiate abuse primarily in the past.  Also depression.  Risk to Self:   Risk to Others:   Prior Inpatient Therapy:   Prior Outpatient Therapy:    Past Medical History: No past medical history on file. No past surgical history on file. Family History: No family history on file. Family Psychiatric  History: Brother with substance abuse problems Social History:  Social History   Substance and Sexual Activity  Alcohol Use Yes     Social History   Substance and Sexual Activity  Drug Use Not Currently   Comment: percocet    Social History   Socioeconomic History  . Marital status: Single    Spouse name: Not on file  . Number of children: Not on file  . Years of education: Not on file  . Highest education level: Not on file  Occupational History  . Not on file  Tobacco Use  . Smoking status: Current Every Day Smoker    Packs/day: 0.50     Types: Cigarettes  . Smokeless tobacco: Never Used  Substance and Sexual Activity  . Alcohol use: Yes  . Drug use: Not Currently    Comment: percocet  . Sexual activity: Not on file  Other Topics Concern  . Not on file  Social History Narrative  . Not on file   Social Determinants of Health   Financial Resource Strain: Not on file  Food Insecurity: Not on file  Transportation Needs: Not on file  Physical Activity: Not on file  Stress: Not on file  Social Connections: Not on file   Additional Social History:    Allergies:  No Known Allergies  Labs:  Results for orders placed or performed during the hospital encounter of 01/13/21 (from the past 48 hour(s))  Urine Drug Screen, Qualitative ([facility] only)     Status: None   Collection Time: 01/13/21  7:51 PM  Result Value Ref Range   Tricyclic, Ur Screen NONE DETECTED NONE DETECTED   Amphetamines, Ur Screen NONE DETECTED NONE DETECTED   MDMA (Ecstasy)Ur Screen NONE DETECTED NONE DETECTED   Cocaine Metabolite,Ur [place] NONE DETECTED NONE DETECTED   Opiate, Ur Screen NONE DETECTED NONE DETECTED   Phencyclidine (PCP) Ur S NONE DETECTED NONE DETECTED   Cannabinoid 50 Ng, Ur [place] NONE DETECTED NONE DETECTED   Barbiturates, Ur Screen NONE DETECTED NONE DETECTED   Benzodiazepine, Ur Scrn NONE DETECTED NONE DETECTED  Methadone Scn, Ur NONE DETECTED NONE DETECTED    Comment: (NOTE) Tricyclics + metabolites, urine    Cutoff 1000 ng/mL Amphetamines + metabolites, urine  Cutoff 1000 ng/mL MDMA (Ecstasy), urine              Cutoff 500 ng/mL Cocaine Metabolite, urine          Cutoff 300 ng/mL Opiate + metabolites, urine        Cutoff 300 ng/mL Phencyclidine (PCP), urine         Cutoff 25 ng/mL Cannabinoid, urine                 Cutoff 50 ng/mL Barbiturates + metabolites, urine  Cutoff 200 ng/mL Benzodiazepine, urine              Cutoff 200 ng/mL Methadone, urine                   Cutoff 300 ng/mL  The urine drug screen provides only a  preliminary, unconfirmed analytical test result and should not be used for non-medical purposes. Clinical consideration and professional judgment should be applied to any positive drug screen result due to possible interfering substances. A more specific alternate chemical method must be used in order to obtain a confirmed analytical result. Gas chromatography / mass spectrometry (GC/MS) is the preferred confirm atory method. Performed at [facility], [address] [place], [place] [address]   CBC     Status: None   Collection Time: 01/13/21  8:05 PM  Result Value Ref Range   WBC 5.8 4.0 - 10.5 K/uL   RBC 5.14 4.22 - 5.81 MIL/uL   Hemoglobin 16.2 13.0 - 17.0 g/dL   HCT 60.448.9 54.039.0 - 98.152.0 %   MCV 95.1 80.0 - 100.0 fL   MCH 31.5 26.0 - 34.0 pg   MCHC 33.1 30.0 - 36.0 g/dL   RDW 19.112.3 47.811.5 - 29.515.5 %   Platelets 166 150 - 400 K/uL   nRBC 0.0 0.0 - 0.2 %    Comment: Performed at [facility], [address] [place], [place] [address]  Comprehensive metabolic panel     Status: Abnormal   Collection Time: 01/13/21  8:05 PM  Result Value Ref Range   Sodium 142 135 - 145 mmol/L   Potassium 4.5 3.5 - 5.1 mmol/L   Chloride 106 98 - 111 mmol/L   CO2 27 22 - 32 mmol/L   Glucose, Bld 116 (H) 70 - 99 mg/dL    Comment: Glucose reference range applies only to samples taken after fasting for at least 8 hours.   BUN 20 6 - 20 mg/dL   Creatinine, Ser 8.651.16 0.61 - 1.24 mg/dL   Calcium 9.1 8.9 - 78.410.3 mg/dL   Total Protein 7.6 6.5 - 8.1 g/dL   Albumin 4.4 3.5 - 5.0 g/dL   AST 37 15 - 41 U/L   ALT 51 (H) 0 - 44 U/L   Alkaline Phosphatase 65 38 - 126 U/L   Total Bilirubin 0.5 0.3 - 1.2 mg/dL   GFR, Estimated >69>60 >62>60 mL/min    Comment: (NOTE) Calculated using the CKD-EPI Creatinine Equation (2021)    Anion gap 9 5 - 15    Comment: Performed at [facility], [address] [place], [place] [address]  Ethanol     Status: Abnormal   Collection Time: 01/13/21  8:05  PM  Result Value Ref Range   Alcohol, Ethyl (B) 21 (H) <10 mg/dL  Comment: (NOTE) Lowest detectable limit for serum alcohol is 10 mg/dL.  For medical purposes only. Performed at [facility], [address] [place], [place] [address]   Salicylate level     Status: Abnormal   Collection Time: 01/13/21  8:05 PM  Result Value Ref Range   Salicylate Lvl <7.0 (L) 7.0 - 30.0 mg/dL    Comment: Performed at [facility], [address] [place], [place] [address]  Acetaminophen level     Status: Abnormal   Collection Time: 01/13/21  8:05 PM  Result Value Ref Range   Acetaminophen (Tylenol), Serum <10 (L) 10 - 30 ug/mL    Comment: (NOTE) Therapeutic concentrations vary significantly. A range of 10-30 ug/mL  may be an effective concentration for many patients. However, some  are best treated at concentrations outside of this range. Acetaminophen concentrations >150 ug/mL at 4 hours after ingestion  and >50 ug/mL at 12 hours after ingestion are often associated with  toxic reactions.  Performed at [facility], [address] [place], [place] [address]     No current facility-administered medications for this encounter.   Current Outpatient Medications  Medication Sig Dispense Refill  . hydrOXYzine (ATARAX/VISTARIL) 50 MG tablet Take 1 tablet (50 mg total) by mouth 3 (three) times daily as needed for anxiety. 30 tablet 1  . pantoprazole (PROTONIX) 40 MG tablet Take 1 tablet (40 mg total) by mouth daily. 30 tablet 1    Musculoskeletal: Strength & Muscle Tone: within normal limits Gait & Station: normal Patient leans: N/A            Psychiatric Specialty Exam:  Presentation  General Appearance: No data recorded Eye Contact:No data recorded Speech:No data recorded Speech Volume:No data recorded Handedness:No data recorded  Mood and Affect  Mood:No data recorded Affect:No data recorded  Thought Process  Thought Processes:No data  recorded Descriptions of Associations:No data recorded Orientation:No data recorded Thought Content:No data recorded History of Schizophrenia/Schizoaffective disorder:No data recorded Duration of Psychotic Symptoms:No data recorded Hallucinations:No data recorded Ideas of Reference:No data recorded Suicidal Thoughts:No data recorded Homicidal Thoughts:No data recorded  Sensorium  Memory:No data recorded Judgment:No data recorded Insight:No data recorded  Executive Functions  Concentration:No data recorded Attention Span:No data recorded Recall:No data recorded Fund of Knowledge:No data recorded Language:No data recorded  Psychomotor Activity  Psychomotor Activity:No data recorded  Assets  Assets:No data recorded  Sleep  Sleep:No data recorded  Physical Exam: Physical Exam Vitals and nursing note reviewed.  Constitutional:      Appearance: Normal appearance.  HENT:     Head: Normocephalic and atraumatic.     Mouth/Throat:     Pharynx: Oropharynx is clear.  Eyes:     Pupils: Pupils are equal, round, and reactive to light.  Cardiovascular:     Rate and Rhythm: Normal rate and regular rhythm.  Pulmonary:     Effort: Pulmonary effort is normal.     Breath sounds: Normal breath sounds.  Abdominal:     General: Abdomen is flat.     Palpations: Abdomen is soft.  Musculoskeletal:        General: Normal range of motion.  Skin:    General: Skin is warm and dry.  Neurological:     General: No focal deficit present.     Mental Status: He is alert. Mental status is at baseline.  Psychiatric:        Mood and Affect: Mood normal.        Thought Content:  Thought content normal.    Review of Systems  Constitutional: Negative.   HENT: Negative.   Eyes: Negative.   Respiratory: Negative.   Cardiovascular: Negative.   Gastrointestinal: Negative.   Musculoskeletal: Negative.   Skin: Negative.   Neurological: Negative.   Psychiatric/Behavioral: Positive for substance  abuse. Negative for depression and suicidal ideas.   Blood pressure 132/85, pulse 72, temperature 98.1 F (36.7 C), temperature source Oral, resp. rate 16, height 6' (1.829 m), weight 120.2 kg, SpO2 99 %. Body mass index is 35.94 kg/m.  Treatment Plan Summary: Plan Patient was very focused on the fact that his drug screen was negative.  I told him that our drug screen does not pick up fentanyl or OxyContin and was not completely reliable but that given that we did not have any evidence of suicidal ideation or behavior there was no need to keep him in the hospital.  Strongly encouraged him to stop drinking as well as other drug use and get involved in appropriate outpatient treatment.  Discontinue IVC can be released from the ER.  Case reviewed with emergency room doctor.  Disposition: Patient does not meet criteria for psychiatric inpatient admission.  Mordecai Rasmussen, MD 01/14/2021 7:20 PM

## 2021-01-14 NOTE — ED Provider Notes (Signed)
[facility] Emergency Department Provider Note  ____________________________________________   Event Date/Time   First MD Initiated Contact with Patient 01/14/21 0133     (approximate)  I have reviewed the triage vital signs and the nursing notes.   HISTORY  Chief Complaint Addiction Problem    HPI Samuel Contreras is a 25 y.o. male with history of substance abuse who presents to the emergency department under IVC by his mother.  Patient was just seen earlier tonight after he had an accidental overdose of fentanyl.  His mother has taken out IVC papers stating "History of substance use and abuse with heroin.  Found unresponsive and overdosing today.  Transported to hospital by EMS.  Recent depression and not taking prescribed medication, Wellbutrin as directed by doctor."  Patient denies SI, HI, hallucinations.  He declines wanting detox.  He complains of headache currently and wanting something to help him sleep.        No past medical history on file.  Patient Active Problem List   Diagnosis Date Noted  . Opiate abuse, continuous (HCC) 03/05/2020  . 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  Prior to Admission medications   Medication Sig Start Date End Date Taking? Authorizing Provider  hydrOXYzine (ATARAX/VISTARIL) 50 MG tablet Take 1 tablet (50 mg total) by mouth 3 (three) times daily as needed for anxiety. 03/06/20   Clapacs, Jackquline Denmark, MD  pantoprazole (PROTONIX) 40 MG tablet Take 1 tablet (40 mg total) by mouth daily. 03/07/20   Clapacs, Jackquline Denmark, MD    Allergies Patient has no known allergies.  No family history on file.  Social History Social History   Tobacco Use  . Smoking status: Current Every Day Smoker    Packs/day: 0.50    Types: Cigarettes  . Smokeless tobacco: Never Used  Substance Use Topics  . Alcohol use: Yes  . Drug  use: Not Currently    Comment: percocet    Review of Systems Constitutional: No fever. Eyes: No visual changes. ENT: No sore throat. Cardiovascular: Denies chest pain. Respiratory: Denies shortness of breath. Gastrointestinal: No nausea, vomiting, diarrhea. Genitourinary: Negative for dysuria. Musculoskeletal: Negative for back pain. Skin: Negative for rash. Neurological: Negative for focal weakness or numbness.  ____________________________________________   PHYSICAL EXAM:  VITAL SIGNS: ED Triage Vitals  Enc Vitals Group     BP 01/14/21 0123 (!) 145/92     Pulse Rate 01/14/21 0123 77     Resp 01/14/21 0123 18     Temp 01/14/21 0123 98.4 F (36.9 C)     Temp Source 01/14/21 0123 Oral     SpO2 01/14/21 0123 93 %     Weight 01/14/21 0126 265 lb (120.2 kg)     Height 01/14/21 0126 6' (1.829 m)     Head Circumference --      Peak Flow --      Pain Score 01/14/21 0126 0     Pain Loc --      Pain Edu? --      Excl. in GC? --    CONSTITUTIONAL: Alert and oriented and responds appropriately to questions. Well-appearing; well-nourished HEAD: Normocephalic EYES: Conjunctivae clear, pupils appear equal, EOM appear intact ENT: normal nose; moist mucous membranes NECK: Supple, normal ROM CARD: RRR; S1 and S2 appreciated; no murmurs, no clicks, no rubs, no gallops RESP: Normal chest excursion without splinting or tachypnea; breath sounds  clear and equal bilaterally; no wheezes, no rhonchi, no rales, no hypoxia or respiratory distress, speaking full sentences ABD/GI: Normal bowel sounds; non-distended; soft, non-tender, no rebound, no guarding, no peritoneal signs, no hepatosplenomegaly BACK: The back appears normal EXT: Normal ROM in all joints; no deformity noted, no edema; no cyanosis SKIN: Normal color for age and race; warm; no rash on exposed skin NEURO: Moves all extremities equally PSYCH: The patient's mood and manner are appropriate.  Normal eye contact.  No depressed  mood.  No SI, HI or hallucinations.  ____________________________________________   LABS (all labs ordered are listed, but only abnormal results are displayed)  Labs Reviewed - No data to display ____________________________________________  EKG  none ____________________________________________  RADIOLOGY I, Marquita Lias, personally viewed and evaluated these images (plain radiographs) as part of my medical decision making, as well as reviewing the written report by the radiologist.  ED MD interpretation:  none  Official radiology report(s): No results found.  ____________________________________________   PROCEDURES  Procedure(s) performed (including Critical Care):  Procedures  ____________________________________________   INITIAL IMPRESSION / ASSESSMENT AND PLAN / ED COURSE  As part of my medical decision making, I reviewed the following data within the electronic MEDICAL RECORD NUMBER Nursing notes reviewed and incorporated, Labs reviewed , Old chart reviewed and Notes from prior ED visits         Patient here under IVC for concerns for not taking his medication and substance abuse with overdose.  He states this was unintentional and denies SI, HI.  We will have psychiatry evaluate patient to determine further disposition.  Patient's labs, urine reviewed from earlier today and unremarkable other than alcohol level of 21.  Drug screen likely negative because he admits to using fentanyl.  At this time patient is medically cleared and we will have psychiatry evaluate him given this was the plan discussed with his mother by previous provider prior to the IVC being taken out.  ED PROGRESS  Awaiting psychiatric evaluation for further disposition.   I reviewed all nursing notes and pertinent previous records as available.  I have reviewed and interpreted any EKGs, lab and urine results, imaging (as available).  ____________________________________________   FINAL  CLINICAL IMPRESSION(S) / ED DIAGNOSES  Final diagnoses:  Substance abuse (HCC)  Involuntary commitment     ED Discharge Orders    None      *Please note:  JAMILL WETMORE was evaluated in Emergency Department on 01/14/2021 for the symptoms described in the history of present illness. He was evaluated in the context of the global COVID-19 pandemic, which necessitated consideration that the patient might be at risk for infection with the SARS-CoV-2 virus that causes COVID-19. Institutional protocols and algorithms that pertain to the evaluation of patients at risk for COVID-19 are in a state of rapid change based on information released by regulatory bodies including the CDC and federal and state organizations. These policies and algorithms were followed during the patient's care in the ED.  Some ED evaluations and interventions may be delayed as a result of limited staffing during and the pandemic.*   Note:  This document was prepared using Dragon voice recognition software and may include unintentional dictation errors.   Iliani Vejar, Layla Maw, DO 01/14/21 0730

## 2021-01-14 NOTE — ED Notes (Addendum)
Returned pt's belongings at this time. Pt left without d/c vital signs at this time. Pt signed hard copy of the d/c signature.

## 2021-01-14 NOTE — ED Notes (Signed)
Pt refused breakfast tray.  

## 2021-01-14 NOTE — ED Notes (Signed)
Patient had been informed that he is up for discharge and requesting to leave. Per EDP patient is able to leave due to being medically cleared and discharged.

## 2021-01-14 NOTE — ED Notes (Signed)
Report to Ashley, RN

## 2021-01-14 NOTE — ED Notes (Signed)
Notified that IVC paperwork is resented at this time.

## 2021-01-14 NOTE — BH Assessment (Signed)
Comprehensive Clinical Assessment (CCA) Note  01/14/2021 Samuel Contreras [id number]   Samuel Contreras, 25 year old male who presents to [facility] ED involuntarily for treatment. Per triage note, Patient arrives from home via [facility] with c/o possible overdose. Patient found unresponsive by family, given CPR instructions. Given 2 narcan at approximately 0650, patient responded to narcan, denies drug use. Patient states all he had today was his prescribed Wellbutrin and alcohol this evening. Patient has no recollection of LOC.     During TTS assessment pt presents alert and oriented x 4, anxious but cooperative, and mood-congruent with affect. The pt does not appear to be responding to internal or external stimuli. Neither is the pt presenting with any delusional thinking. Pt verified the information provided to triage RN.   Pt identifies his main complaint to be that he was drinking with friends and became unconscious. Patient states his family thought he overdosed on drugs and called EMS. Patient reports this is out of his character however, he admitted to being on probation for a DUI charge. Pt reports SA with alcohol but states prior to this incident he was clean for 1 year. Pt reports no current INPT hx yet sees a therapist (Dr. Hyacinth Contreras) for anxiety. Pt reports family hx of MH/SA (brother was in rehab). Pt denies SI/HI/AH/VH.   Per Dr. Toni Contreras pt does not meet criteria for psychiatric inpatient admission.        Chief Complaint:  Chief Complaint  Patient presents with  . Addiction Problem   Visit Diagnosis: Alcohol Use Disorder   CCA Screening, Triage and Referral (STR)  Patient Reported Information How did you hear about [place]? Family/Friend  Referral name: No data recorded Referral phone number: No data recorded  Whom do you see for routine medical problems? No data recorded Practice/Facility Name: No data recorded Practice/Facility Phone Number: No data recorded Name of Contact: No data  recorded Contact Number: No data recorded Contact Fax Number: No data recorded Prescriber Name: No data recorded Prescriber Address (if known): No data recorded  What Is the Reason for Your Visit/Call Today? Patient reports he was drinking with friends and became unconscious. Patient states his family thought he overdosed and called EMS.  How Long Has This Been Causing You Problems? <Week  What Do You Feel Would Help You the Most Today? Social Support   Have You Recently Been in Any Inpatient Treatment (Hospital/Detox/Crisis Center/28-Day Program)? No  Name/Location of Program/Hospital:No data recorded How Long Were You There? No data recorded When Were You Discharged? No data recorded  Have You Ever Received Services From [facility] Before? No  Who Do You See at [facility]? No data recorded  Have You Recently Had Any Thoughts About Hurting Yourself? No  Are You Planning to Commit Suicide/Harm Yourself At This time? No   Have you Recently Had Thoughts About Hurting Someone Samuel Contreras? No  Explanation: No data recorded  Have You Used Any Alcohol or Drugs in the Past 24 Hours? Yes  How Long Ago Did You Use Drugs or Alcohol? No data recorded What Did You Use and How Much? Alcohol...patient states he drank close to half of 1/5th.   Do You Currently Have a Therapist/Psychiatrist? No  Name of Therapist/Psychiatrist: No data recorded  Have You Been Recently Discharged From Any Office Practice or Programs? No  Explanation of Discharge From Practice/Program: No data recorded    CCA Screening Triage Referral Assessment Type of Contact: Face-to-Face  Is this Initial or Reassessment? No data  recorded Date Telepsych consult ordered in [facility]:  No data recorded Time Telepsych consult ordered in [facility]:  No data recorded  Patient Reported Information Reviewed? Yes  Patient Left Without Being Seen? No data recorded Reason for Not Completing Assessment: No data recorded  Collateral  Involvement: None provided   Does Patient Have a Court Appointed Legal Guardian? No data recorded Name and Contact of Legal Guardian: Self  If Minor and Not Living with Parent(s), Who has Custody? n/a  Is CPS involved or ever been involved? Never  Is APS involved or ever been involved? Never   Patient Determined To Be At Risk for Harm To Self or Others Based on Review of Patient Reported Information or Presenting Complaint? No  Method: No data recorded Availability of Means: No data recorded Intent: No data recorded Notification Required: No data recorded Additional Information for Danger to Others Potential: No data recorded Additional Comments for Danger to Others Potential: No data recorded Are There Guns or Other Weapons in Your Home? Yes  Types of Guns/Weapons: "shotguns and rifles"  Are These Weapons Safely Secured?                            Yes (In gun safe)  Who Could Verify You Are Able To Have These Secured: No data recorded Do You Have any Outstanding Charges, Pending Court Dates, Parole/Probation? No data recorded Contacted To Inform of Risk of Harm To Self or Others: No data recorded  Location of Assessment: [facility] ED   Does Patient Present under Involuntary Commitment? Yes  IVC Papers Initial File Date: 01/14/2021   [place] of Residence: [place]   Patient Currently Receiving the Following Services: Not Receiving Services   Determination of Need: Emergent (2 hours)   Options For Referral: ED Visit     CCA Biopsychosocial Intake/Chief Complaint:  No data recorded Current Symptoms/Problems: No data recorded  Patient Reported Schizophrenia/Schizoaffective Diagnosis in Past: No data recorded  Strengths: No data recorded Preferences: No data recorded Abilities: No data recorded  Type of Services Patient Feels are Needed: No data recorded  Initial Clinical Notes/Concerns: No data recorded  Mental Health Symptoms Depression:  No data recorded   Duration of Depressive symptoms: No data recorded  Mania:  No data recorded  Anxiety:   No data recorded  Psychosis:  No data recorded  Duration of Psychotic symptoms: No data recorded  Trauma:  No data recorded  Obsessions:  No data recorded  Compulsions:  No data recorded  Inattention:  No data recorded  Hyperactivity/Impulsivity:  No data recorded  Oppositional/Defiant Behaviors:  No data recorded  Emotional Irregularity:  No data recorded  Other Mood/Personality Symptoms:  No data recorded   Mental Status Exam Appearance and self-care  Stature:  No data recorded  Weight:  No data recorded  Clothing:  No data recorded  Grooming:  No data recorded  Cosmetic use:  No data recorded  Posture/gait:  No data recorded  Motor activity:  No data recorded  Sensorium  Attention:  No data recorded  Concentration:  No data recorded  Orientation:  No data recorded  Recall/memory:  No data recorded  Affect and Mood  Affect:  No data recorded  Mood:  No data recorded  Relating  Eye contact:  No data recorded  Facial expression:  No data recorded  Attitude toward examiner:  No data recorded  Thought and Language  Speech flow: No data recorded  Thought content:  No data recorded  Preoccupation:  No data recorded  Hallucinations:  No data recorded  Organization:  No data recorded  [employer] of Knowledge:  No data recorded  Intelligence:  No data recorded  Abstraction:  No data recorded  Judgement:  No data recorded  Reality Testing:  No data recorded  Insight:  No data recorded  Decision Making:  No data recorded  Social Functioning  Social Maturity:  No data recorded  Social Judgement:  No data recorded  Stress  Stressors:  No data recorded  Coping Ability:  No data recorded  Skill Deficits:  No data recorded  Supports:  No data recorded    Religion:    Leisure/Recreation:    Exercise/Diet:     CCA Employment/Education Employment/Work  Situation:    Education:     CCA Family/Childhood History Family and Relationship History:    Childhood History:     Child/Adolescent Assessment:     CCA Substance Use Alcohol/Drug Use:                           ASAM's:  Six Dimensions of Multidimensional Assessment  Dimension 1:  Acute Intoxication and/or Withdrawal Potential:      Dimension 2:  Biomedical Conditions and Complications:      Dimension 3:  Emotional, Behavioral, or Cognitive Conditions and Complications:     Dimension 4:  Readiness to Change:     Dimension 5:  Relapse, Continued use, or Continued Problem Potential:     Dimension 6:  Recovery/Living Environment:     ASAM Severity Score:    ASAM Recommended Level of Treatment:     Substance use Disorder (SUD)    Recommendations for Services/Supports/Treatments:    DSM5 Diagnoses: Patient Active Problem List   Diagnosis Date Noted  . Opiate abuse, continuous (HCC) 03/05/2020  . 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

## 2021-01-14 NOTE — ED Triage Notes (Signed)
Pt to ED via [employer] officers arrived with pt under IVC from mother for not taking prescribed meds and then overdosing on illegal substances  Pt is calm and cooperative, pt was just DC'd, per Dr Elesa Massed: no repeat labs - dress out pt and await psych eval in am

## 2021-01-14 NOTE — ED Notes (Signed)
Pt dressed into wine colored scrubs and taken to [facility]

## 2021-01-14 NOTE — ED Notes (Signed)
Patient was seen earlier for possible overdose.  After being discharged patient was IVC'd by parents for problems with drugs.  Patient awake, alert and oriented.  Patient denies SI or HI at this time.

## 2021-01-14 NOTE — ED Notes (Signed)
Up to bathroom to brush teeth.

## 2021-02-03 ENCOUNTER — Emergency Department: Payer: BC Managed Care – PPO

## 2021-02-03 ENCOUNTER — Emergency Department
Admission: EM | Admit: 2021-02-03 | Discharge: 2021-02-03 | Disposition: A | Discharge status: Home / Self Care | Payer: BC Managed Care – PPO | Attending: Emergency Medicine | Admitting: Emergency Medicine

## 2021-02-03 ENCOUNTER — Other Ambulatory Visit: Payer: Self-pay

## 2021-02-03 ENCOUNTER — Encounter: Payer: Self-pay | Admitting: Emergency Medicine

## 2021-02-03 DIAGNOSIS — R410 Disorientation, unspecified: Secondary | ICD-10-CM | POA: Insufficient documentation

## 2021-02-03 DIAGNOSIS — R400 Somnolence: Secondary | ICD-10-CM | POA: Diagnosis not present

## 2021-02-03 DIAGNOSIS — R000 Tachycardia, unspecified: Secondary | ICD-10-CM | POA: Insufficient documentation

## 2021-02-03 LAB — CBC WITH DIFFERENTIAL/PLATELET
Abs Immature Granulocytes: 0.03 10*3/uL (ref 0.00–0.07)
Basophils Absolute: 0 10*3/uL (ref 0.0–0.1)
Basophils Relative: 0 %
Eosinophils Absolute: 0 10*3/uL (ref 0.0–0.5)
Eosinophils Relative: 0 %
HCT: 41.7 % (ref 39.0–52.0)
Hemoglobin: 14.1 g/dL (ref 13.0–17.0)
Immature Granulocytes: 0 %
Lymphocytes Relative: 10 %
Lymphs Abs: 0.7 10*3/uL (ref 0.7–4.0)
MCH: 32.2 pg (ref 26.0–34.0)
MCHC: 33.8 g/dL (ref 30.0–36.0)
MCV: 95.2 fL (ref 80.0–100.0)
Monocytes Absolute: 0.5 10*3/uL (ref 0.1–1.0)
Monocytes Relative: 7 %
Neutro Abs: 5.9 10*3/uL (ref 1.7–7.7)
Neutrophils Relative %: 83 %
Platelets: 182 10*3/uL (ref 150–400)
RBC: 4.38 MIL/uL (ref 4.22–5.81)
RDW: 12.7 % (ref 11.5–15.5)
WBC: 7.2 10*3/uL (ref 4.0–10.5)
nRBC: 0 % (ref 0.0–0.2)

## 2021-02-03 LAB — URINE DRUG SCREEN, QUALITATIVE (ARMC ONLY)
Amphetamines, Ur Screen: NOT DETECTED
Barbiturates, Ur Screen: NOT DETECTED
Benzodiazepine, Ur Scrn: NOT DETECTED
Cannabinoid 50 Ng, Ur ~~LOC~~: POSITIVE — AB
Cocaine Metabolite,Ur ~~LOC~~: NOT DETECTED
MDMA (Ecstasy)Ur Screen: NOT DETECTED
Methadone Scn, Ur: NOT DETECTED
Opiate, Ur Screen: NOT DETECTED
Phencyclidine (PCP) Ur S: NOT DETECTED
Tricyclic, Ur Screen: NOT DETECTED

## 2021-02-03 LAB — COMPREHENSIVE METABOLIC PANEL
ALT: 73 U/L — ABNORMAL HIGH (ref 0–44)
AST: 72 U/L — ABNORMAL HIGH (ref 15–41)
Albumin: 4.1 g/dL (ref 3.5–5.0)
Alkaline Phosphatase: 60 U/L (ref 38–126)
Anion gap: 11 (ref 5–15)
BUN: 28 mg/dL — ABNORMAL HIGH (ref 6–20)
CO2: 25 mmol/L (ref 22–32)
Calcium: 8.3 mg/dL — ABNORMAL LOW (ref 8.9–10.3)
Chloride: 99 mmol/L (ref 98–111)
Creatinine, Ser: 1.18 mg/dL (ref 0.61–1.24)
GFR, Estimated: >60 mL/min (ref >60)
Glucose, Bld: 115 mg/dL — ABNORMAL HIGH (ref 70–99)
Potassium: 4.3 mmol/L (ref 3.5–5.1)
Sodium: 135 mmol/L (ref 135–145)
Total Bilirubin: 0.6 mg/dL (ref 0.3–1.2)
Total Protein: 7.2 g/dL (ref 6.5–8.1)

## 2021-02-03 LAB — ACETAMINOPHEN LEVEL: Acetaminophen (Tylenol), Serum: <10 ug/mL — ABNORMAL LOW (ref 10–30)

## 2021-02-03 LAB — ETHANOL: Alcohol, Ethyl (B): <10 mg/dL (ref <10)

## 2021-02-03 LAB — SALICYLATE LEVEL: Salicylate Lvl: <7 mg/dL — ABNORMAL LOW (ref 7.0–30.0)

## 2021-02-03 MED ORDER — ONDANSETRON HCL 4 MG/2ML IJ SOLN
INTRAMUSCULAR | Status: AC
  Administered 2021-02-03: 4 mg via INTRAVENOUS
  Filled 2021-02-03: qty 2

## 2021-02-03 MED ORDER — ONDANSETRON HCL 4 MG/2ML IJ SOLN: 4.0000 mg | Freq: Once | INTRAMUSCULAR | Status: AC

## 2021-02-03 MED ORDER — SODIUM CHLORIDE 0.9 % IV BOLUS
1000.0000 mL | Freq: Once | INTRAVENOUS | Status: AC
  Administered 2021-02-03: 1000 mL via INTRAVENOUS

## 2021-02-03 NOTE — ED Notes (Signed)
Pt refusing to tell RN what he took. Pt pupils pinpoint and pt falling asleep and desatting to 88%. Pt asks if his parents are here and this RN states no but pt remains adamant that he didn't take anything. Brother coming back to sit with patient.

## 2021-02-03 NOTE — ED Notes (Signed)
Per Dr Katrinka Blazing, no need for narcan at this time. Continuing to monitor.

## 2021-02-03 NOTE — ED Notes (Signed)
Pt back from CT

## 2021-02-03 NOTE — ED Triage Notes (Signed)
Pt to ED via [facility] from family's home. Per EMS family reports that pt has been there since last night, family reported to EMS that pt has become increasingly confused. EMS reports pt disoriented to time and place. Pts CBG was 119, Hr was around 110, last BP 150/82. Pt family denies alcohol or drug use that they are aware of.   Pt is currently alert but disoriented to time and situation. Pt has swollen lip that he says is from him accidentally biting his lip yesterday. Pt c/o being [facility] but he is able to speak in complete sentences and has normal work of breathing.

## 2021-02-03 NOTE — ED Notes (Signed)
Per brother at bedside, pt has fentanyl use hx.

## 2021-02-03 NOTE — ED Notes (Signed)
Pt here with AMS, unable to sign MSE waiver at this time.

## 2021-02-03 NOTE — ED Provider Notes (Signed)
[facility] Emergency Department Provider Note  ____________________________________________  Time seen: Approximately 3:20 PM  I have reviewed the triage vital signs and the nursing notes.   HISTORY  Chief Complaint Altered Mental Status (? Overdose)    HPI Samuel Contreras is a 25 y.o. male with a history of polysubstance abuse who was brought to the ED by family due to concerns about confusion and excessive sleepiness this morning.  Patient also had a trip and fall and hit his head, but no laceration or bleeding.  Patient states that he feels fine, he sitting upright and drinking water.  Denies any acute pain or other complaints.  He is somewhat evasive.      History reviewed. No pertinent past medical history.   Patient Active Problem List   Diagnosis Date Noted  . Alcohol abuse 01/14/2021  . Opiate abuse, continuous (HCC) 03/05/2020  . 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 No pertinent surgical history.   Prior to Admission medications   Medication Sig Start Date End Date Taking? Authorizing Provider  hydrOXYzine (ATARAX/VISTARIL) 50 MG tablet Take 1 tablet (50 mg total) by mouth 3 (three) times daily as needed for anxiety. 03/06/20   Clapacs, Jackquline Denmark, MD  pantoprazole (PROTONIX) 40 MG tablet Take 1 tablet (40 mg total) by mouth daily. 03/07/20   Clapacs, Jackquline Denmark, MD     Allergies Patient has no known allergies.   No family history on file.  Social History Social History   Tobacco Use  . Smoking status: Current Every Day Smoker    Packs/day: 0.50    Types: Cigarettes  . Smokeless tobacco: Never Used  Substance Use Topics  . Alcohol use: Yes  . Drug use: Not Currently    Comment: percocet    Review of Systems  Constitutional:   No fever or chills.  ENT:   No sore throat. No rhinorrhea. Cardiovascular:   No chest pain or  syncope. Respiratory:   No dyspnea or cough. Gastrointestinal:   Negative for abdominal pain, vomiting and diarrhea.  Musculoskeletal:   Negative for focal pain or swelling All other systems reviewed and are negative except as documented above in ROS and HPI.  ____________________________________________   PHYSICAL EXAM:  VITAL SIGNS: ED Triage Vitals  Enc Vitals Group     BP 02/03/21 1252 118/72     Pulse Rate 02/03/21 1252 (!) 102     Resp 02/03/21 1252 16     Temp 02/03/21 1252 97.9 F (36.6 C)     Temp Source 02/03/21 1252 Oral     SpO2 02/03/21 1252 93 %     Weight 02/03/21 1250 264 lb 8.8 oz (120 kg)     Height 02/03/21 1250 6' (1.829 m)     Head Circumference --      Peak Flow --      Pain Score 02/03/21 1250 0     Pain Loc --      Pain Edu? --      Excl. in GC? --     Vital signs reviewed, nursing assessments reviewed.   Constitutional:   Alert and oriented. Non-toxic appearance. Eyes:   Conjunctivae are normal. EOMI. PERRL. ENT      Head:   Normocephalic and atraumatic.      Nose:   Wearing a mask.      Mouth/Throat: Dry mucous membranes      Neck:  No meningismus. Full ROM. Hematological/Lymphatic/Immunilogical:   No cervical lymphadenopathy. Cardiovascular:   RRR. Symmetric bilateral radial and DP pulses.  No murmurs. Cap refill less than 2 seconds. Respiratory:   Normal respiratory effort without tachypnea/retractions. Breath sounds are clear and equal bilaterally. No wheezes/rales/rhonchi. Gastrointestinal:   Soft and nontender. Non distended. There is no CVA tenderness.  No rebound, rigidity, or guarding. Genitourinary:   deferred Musculoskeletal:   Normal range of motion in all extremities. No joint effusions.  No lower extremity tenderness.  No edema. Neurologic:   Normal speech and language.  Motor grossly intact. Ambulatory with steady gait No acute focal neurologic deficits are appreciated.  Skin:    Skin is warm, dry and intact. No rash noted.   No petechiae, purpura, or bullae.  ____________________________________________    LABS (pertinent positives/negatives) (all labs ordered are listed, but only abnormal results are displayed) Labs Reviewed  COMPREHENSIVE METABOLIC PANEL - Abnormal; Notable for the following components:      Result Value   Glucose, Bld 115 (*)    BUN 28 (*)    Calcium 8.3 (*)    AST 72 (*)    ALT 73 (*)    All other components within normal limits  SALICYLATE LEVEL - Abnormal; Notable for the following components:   Salicylate Lvl <7.0 (*)    All other components within normal limits  ACETAMINOPHEN LEVEL - Abnormal; Notable for the following components:   Acetaminophen (Tylenol), Serum <10 (*)    All other components within normal limits  URINE DRUG SCREEN, QUALITATIVE ([facility] ONLY) - Abnormal; Notable for the following components:   Cannabinoid 50 Ng, Ur [place] POSITIVE (*)    All other components within normal limits  CBC WITH DIFFERENTIAL/PLATELET  ETHANOL  CBC  CBG MONITORING, ED   ____________________________________________   EKG  Interpreted by me Sinus tachycardia rate 101.  Normal axis and intervals.  Poor R wave progression.  Normal ST segments and T waves  ____________________________________________    RADIOLOGY  CT Head Wo Contrast  Result Date: 02/03/2021 CLINICAL DATA:  Mental status change, increasing confusion EXAM: CT HEAD WITHOUT CONTRAST TECHNIQUE: Contiguous axial images were obtained from the base of the skull through the vertex without intravenous contrast. COMPARISON:  05/26/2014 FINDINGS: Brain: No evidence of acute infarction, hemorrhage, hydrocephalus, extra-axial collection or mass lesion/mass effect. Vascular: No hyperdense vessel or unexpected calcification. Skull: Normal. Negative for fracture or focal lesion. Sinuses/Orbits: No acute finding. Other: None IMPRESSION: Negative Electronically Signed   By: Corlis Leak M.D.   On: 02/03/2021 14:58     ____________________________________________   PROCEDURES Procedures  ____________________________________________  DIFFERENTIAL DIAGNOSIS   Intracranial hemorrhage, intoxication, dehydration, electrolyte abnormality  CLINICAL IMPRESSION / ASSESSMENT AND PLAN / ED COURSE  Medications ordered in the ED: Medications  sodium chloride 0.9 % bolus 1,000 mL (1,000 mLs Intravenous New Bag/Given 02/03/21 1403)    Pertinent labs & imaging results that were available during my care of the patient were reviewed by me and considered in my medical decision making (see chart for details).  CARDIN NITSCHKE was evaluated in Emergency Department on 02/03/2021 for the symptoms described in the history of present illness. He was evaluated in the context of the global COVID-19 pandemic, which necessitated consideration that the patient might be at risk for infection with the SARS-CoV-2 virus that causes COVID-19. Institutional protocols and algorithms that pertain to the evaluation of patients at risk for COVID-19 are in a state of rapid change based on information  released by regulatory bodies including the CDC and federal and state organizations. These policies and algorithms were followed during the patient's care in the ED.   Patient to ED with confusion, suspect opiate abuse.  UDS is negative, vital signs unremarkable.  Currently he is awake, tolerating oral intake, interactive.  CT head is negative.  Stable for discharge.      ____________________________________________   FINAL CLINICAL IMPRESSION(S) / ED DIAGNOSES    Final diagnoses:  Confusion     ED Discharge Orders    None      Portions of this note were generated with dragon dictation software. Dictation errors may occur despite best attempts at proofreading.   Sharman Cheek, MD 02/03/21 [phone]

## 2021-02-03 NOTE — ED Notes (Signed)
Pt walked to bathroom for urine sample. Very unsteady even with assistance.

## 2021-02-03 NOTE — ED Notes (Signed)
Pt taken to CT.

## 2021-02-03 NOTE — ED Notes (Signed)
Pt vomited on himself. Was given zofran IV. Wanted a change of clothes. Then refused to finish his fluids. IV removed and pt ambulatory with steady gait to change and walk to lobby. Brother remains with pt and is taking pt home.

## 2021-02-26 ENCOUNTER — Other Ambulatory Visit: Payer: Self-pay

## 2021-02-26 ENCOUNTER — Emergency Department
Admission: EM | Admit: 2021-02-26 | Discharge: 2021-02-26 | Disposition: A | Discharge status: Home / Self Care | Payer: BC Managed Care – PPO | Attending: Emergency Medicine | Admitting: Emergency Medicine

## 2021-02-26 DIAGNOSIS — X58XXXA Exposure to other specified factors, initial encounter: Secondary | ICD-10-CM | POA: Insufficient documentation

## 2021-02-26 DIAGNOSIS — T402X1A Poisoning by other opioids, accidental (unintentional), initial encounter: Secondary | ICD-10-CM | POA: Insufficient documentation

## 2021-02-26 DIAGNOSIS — R4189 Other symptoms and signs involving cognitive functions and awareness: Secondary | ICD-10-CM | POA: Diagnosis not present

## 2021-02-26 DIAGNOSIS — F17210 Nicotine dependence, cigarettes, uncomplicated: Secondary | ICD-10-CM | POA: Diagnosis not present

## 2021-02-26 DIAGNOSIS — T40601A Poisoning by unspecified narcotics, accidental (unintentional), initial encounter: Secondary | ICD-10-CM

## 2021-02-26 NOTE — ED Triage Notes (Signed)
BIB [facility] from [place] parking lot where bystander called because pt asleep at wheel. Pt admits to snorting fentanyl today, first time since October. No etoh use today. arousalable by [employer]. Ambulated into ER. Denies SI or HI. EDP at bedside. No complaints.
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
## 2021-02-26 NOTE — ED Notes (Signed)
Pt states he has a ride here and is ready to go. Pt walked out of ED, stable in NAD. RR even and unlabored. Given discharge paperwork. No repeat VS obtained before he left.

## 2021-04-21 ENCOUNTER — Other Ambulatory Visit: Payer: Self-pay

## 2021-04-21 ENCOUNTER — Ambulatory Visit (HOSPITAL_COMMUNITY)
Admission: EM | Admit: 2021-04-21 | Discharge: 2021-04-21 | Disposition: A | Discharge status: Home / Self Care | Payer: BC Managed Care – PPO | Attending: Emergency Medicine | Admitting: Emergency Medicine

## 2021-04-21 ENCOUNTER — Encounter (HOSPITAL_COMMUNITY): Payer: Self-pay

## 2021-04-21 DIAGNOSIS — H5789 Other specified disorders of eye and adnexa: Secondary | ICD-10-CM

## 2021-04-21 MED ORDER — POLYMYXIN B-TRIMETHOPRIM 10000-0.1 UNIT/ML-% OP SOLN: 1.0000 [drp] | OPHTHALMIC | 0 refills | Status: AC

## 2021-04-21 NOTE — ED Provider Notes (Signed)
[facility]    CSN: [id number] Arrival date & time: 04/21/21  1619      History   Chief Complaint Chief Complaint  Patient presents with   Contact stuck    HPI Samuel Contreras is a 25 y.o. male.   Patient presents with contact stuck in right eye.  Has been attempting to remove all day with no success.  Using multi day contacts therefore he has been sleeping in them with no issue. Attest to wearing contacts for many years with no issue.  Did have some yellowish drainage today and eye is red. denies blurred vision, diplopia, headache, eye pain, itching.  History reviewed. No pertinent past medical history.  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 No pertinent surgical history.     Home Medications    Prior to Admission medications   Medication Sig Start Date End Date Taking? Authorizing Provider  pantoprazole (PROTONIX) 40 MG tablet Take 1 tablet (40 mg total) by mouth daily. 03/07/20  Yes Clapacs, Jackquline Denmark, MD  trimethoprim-polymyxin b (POLYTRIM) ophthalmic solution Place 1 drop into the right eye every 4 (four) hours. 04/21/21  Yes Theressa Piedra R, NP  hydrOXYzine (ATARAX/VISTARIL) 50 MG tablet Take 1 tablet (50 mg total) by mouth 3 (three) times daily as needed for anxiety. 03/06/20   Clapacs, Jackquline Denmark, MD    Family History History reviewed. No pertinent family history.  Social History Social History   Tobacco Use   Smoking status: Every Day    Packs/day: 0.50    Pack years: 0.00    Types: Cigarettes   Smokeless tobacco: Never  Substance Use Topics   Alcohol use: Not Currently   Drug use: Not Currently    Comment: percocet     Allergies   Patient has no known allergies.   Review of Systems Review of Systems Deferred  HPI   Physical Exam Triage Vital Signs ED Triage Vitals  Enc Vitals Group     BP 04/21/21 1657 123/68     Pulse Rate 04/21/21 1657 90     Resp 04/21/21 1657 18     Temp 04/21/21 1657 98 F (36.7 C)     Temp Source 04/21/21 1657 Oral     SpO2 04/21/21 1657 100 %     Weight --      Height --      Head Circumference --      Peak Flow --      Pain Score 04/21/21 1656 0     Pain Loc --      Pain Edu? --      Excl. in GC? --    No data found.  Updated Vital Signs BP 123/68 (BP Location: Left Arm)   Pulse 90   Temp 98 F (36.7 C) (Oral)   Resp 18   SpO2 100%   Visual Acuity Right Eye Distance:   Left Eye Distance:   Bilateral Distance:    Right Eye Near:   Left Eye Near:    Bilateral Near:     Physical Exam Constitutional:      Appearance: Normal appearance. He is normal weight.  HENT:     Head: Normocephalic.  Eyes:     Extraocular Movements: Extraocular movements intact.     Conjunctiva/sclera: Conjunctivae normal.  Pupils: Pupils are equal, round, and reactive to light.     Comments: Contact present over the sclera in bilateral eyes  Cardiovascular:     Rate and Rhythm: Normal rate and regular rhythm.  Pulmonary:     Breath sounds: Normal breath sounds.  Skin:    General: Skin is warm and dry.  Neurological:     Mental Status: He is alert and oriented to person, place, and time. Mental status is at baseline.  Psychiatric:        Mood and Affect: Mood normal.        Behavior: Behavior normal.     UC Treatments / Results  Labs (all labs ordered are listed, but only abnormal results are displayed) Labs Reviewed - No data to display  EKG   Radiology No results found.  Procedures Procedures (including critical care time)  Medications Ordered in UC Medications - No data to display  Initial Impression / Assessment and Plan / UC Course  I have reviewed the triage vital signs and the nursing notes.  Pertinent labs & imaging results that were  available during my care of the patient were reviewed by me and considered in my medical decision making (see chart for details).  Contact lens stuck  Completed 10 mL, able to manually remove contact from right eyelid  1.  Polytrim drops every 4 hours for the next 3days, prophylactic for bacterial conjunctivitis Final Clinical Impressions(s) / UC Diagnoses   Final diagnoses:  Contact lens stuck     Discharge Instructions      Place one drop in right every 4 hours while awake for next three day  for prophylactic treatment    ED Prescriptions     Medication Sig Dispense Auth. Provider   trimethoprim-polymyxin b (POLYTRIM) ophthalmic solution Place 1 drop into the right eye every 4 (four) hours. 10 mL Valinda Hoar, NP      PDMP not reviewed this encounter.   Valinda Hoar, NP 04/22/21 [phone]

## 2021-04-21 NOTE — ED Triage Notes (Signed)
Pt reports this morning he noticed contact stuck in right eye. States they are multi-day contacts and normally doesn't have an issue sleeping with them in.

## 2021-04-21 NOTE — Discharge Instructions (Addendum)
Place one drop in right every 4 hours while awake for next three day  for prophylactic treatment

## 2021-06-22 ENCOUNTER — Emergency Department
Admission: EM | Admit: 2021-06-22 | Discharge: 2021-06-22 | Disposition: A | Discharge status: Home / Self Care | Payer: BC Managed Care – PPO | Attending: Emergency Medicine | Admitting: Emergency Medicine

## 2021-06-22 ENCOUNTER — Other Ambulatory Visit: Payer: Self-pay

## 2021-06-22 ENCOUNTER — Emergency Department: Payer: BC Managed Care – PPO

## 2021-06-22 DIAGNOSIS — F17210 Nicotine dependence, cigarettes, uncomplicated: Secondary | ICD-10-CM | POA: Insufficient documentation

## 2021-06-22 DIAGNOSIS — Z23 Encounter for immunization: Secondary | ICD-10-CM | POA: Diagnosis not present

## 2021-06-22 DIAGNOSIS — S83402A Sprain of unspecified collateral ligament of left knee, initial encounter: Secondary | ICD-10-CM | POA: Insufficient documentation

## 2021-06-22 DIAGNOSIS — Y92410 Unspecified street and highway as the place of occurrence of the external cause: Secondary | ICD-10-CM | POA: Insufficient documentation

## 2021-06-22 DIAGNOSIS — S8992XA Unspecified injury of left lower leg, initial encounter: Secondary | ICD-10-CM | POA: Diagnosis present

## 2021-06-22 DIAGNOSIS — S8392XA Sprain of unspecified site of left knee, initial encounter: Secondary | ICD-10-CM

## 2021-06-22 MED ORDER — IBUPROFEN 400 MG PO TABS
400.0000 mg | ORAL_TABLET | Freq: Once | ORAL | Status: AC
  Administered 2021-06-22: 400 mg via ORAL
  Filled 2021-06-22: qty 1

## 2021-06-22 MED ORDER — ACETAMINOPHEN 500 MG PO TABS
1000.0000 mg | ORAL_TABLET | Freq: Once | ORAL | Status: DC
  Filled 2021-06-22: qty 2

## 2021-06-22 MED ORDER — TETANUS-DIPHTHERIA TOXOIDS TD 5-2 LFU IM INJ
0.5000 mL | INJECTION | Freq: Once | INTRAMUSCULAR | Status: AC
  Administered 2021-06-22: 0.5 mL via INTRAMUSCULAR
  Filled 2021-06-22: qty 0.5

## 2021-06-22 NOTE — ED Notes (Signed)
Pt advised he has taken 6 500mg  tylenol already today for pain. I will notify MD about DC the order for PO tylenol.
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
## 2021-06-22 NOTE — Discharge Instructions (Addendum)
Your x-ray and CAT scan did not show any broken bones.  You likely have an injury to one of the ligaments in your knee.  Please continue to take Motrin and Tylenol for pain.  If you have ongoing symptoms, please follow-up with orthopedics in clinic.

## 2021-07-01 ENCOUNTER — Other Ambulatory Visit: Payer: Self-pay | Admitting: Orthopedic Surgery

## 2021-07-01 DIAGNOSIS — M2352 Chronic instability of knee, left knee: Secondary | ICD-10-CM

## 2021-07-01 DIAGNOSIS — M2392 Unspecified internal derangement of left knee: Secondary | ICD-10-CM

## 2021-07-01 DIAGNOSIS — S8392XA Sprain of unspecified site of left knee, initial encounter: Secondary | ICD-10-CM

## 2021-07-02 ENCOUNTER — Other Ambulatory Visit: Payer: Self-pay

## 2021-07-02 ENCOUNTER — Ambulatory Visit
Admission: RE | Admit: 2021-07-02 | Discharge: 2021-07-02 | Disposition: A | Discharge status: Home / Self Care | Payer: BC Managed Care – PPO | Source: Ambulatory Visit | Attending: Orthopedic Surgery | Admitting: Orthopedic Surgery

## 2021-07-02 DIAGNOSIS — M2352 Chronic instability of knee, left knee: Secondary | ICD-10-CM | POA: Insufficient documentation

## 2021-07-02 DIAGNOSIS — M2392 Unspecified internal derangement of left knee: Secondary | ICD-10-CM | POA: Diagnosis not present

## 2021-07-02 DIAGNOSIS — S8392XA Sprain of unspecified site of left knee, initial encounter: Secondary | ICD-10-CM | POA: Insufficient documentation

## 2021-12-19 IMAGING — CT CT HEAD W/O CM
3 series · 16 of 47 positions shown, 19 images · non-contrast
Comparison: 05/26/2014

CLINICAL DATA: Mental status change, increasing confusion

EXAM:
CT HEAD WITHOUT CONTRAST
TECHNIQUE: Contiguous axial images were obtained from the base of the skull
through the vertex without intravenous contrast.

[Series 3: coronal soft tissue · coronal · 0.29mm/px · 3 of 68 slices shown]
[im 23/68  brain]
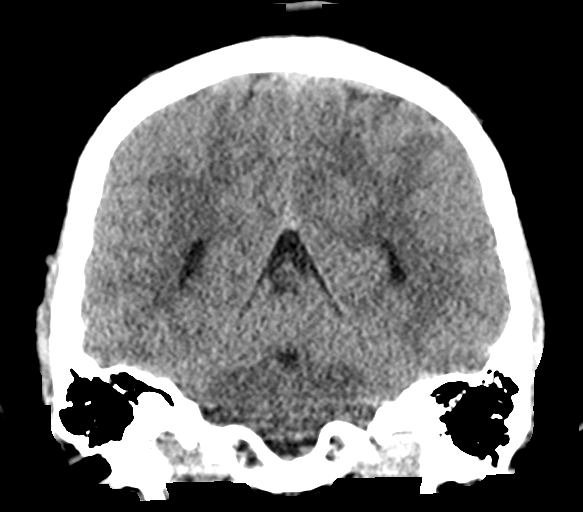
[im 30/68  brain]
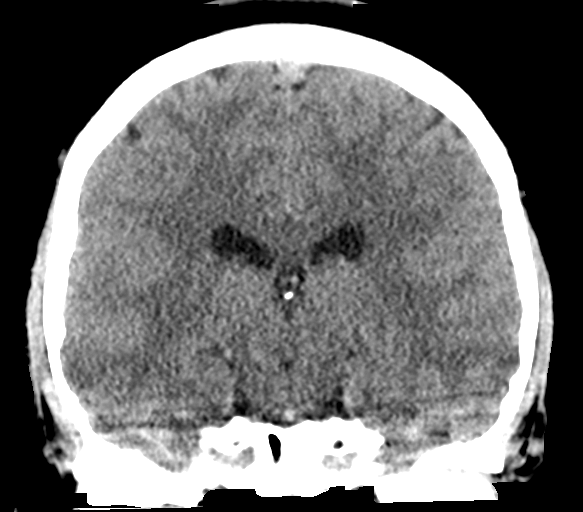
[im 38/68  brain]
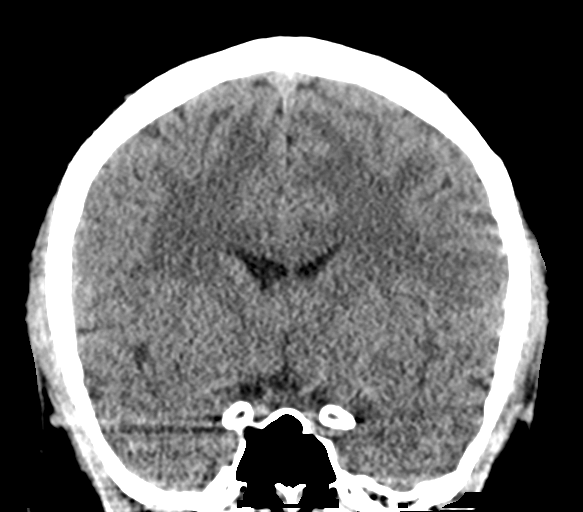

[Series 4: sagittal soft tissue · sagittal · 0.29mm/px · 3 of 58 slices shown]
[im 20/58  brain]
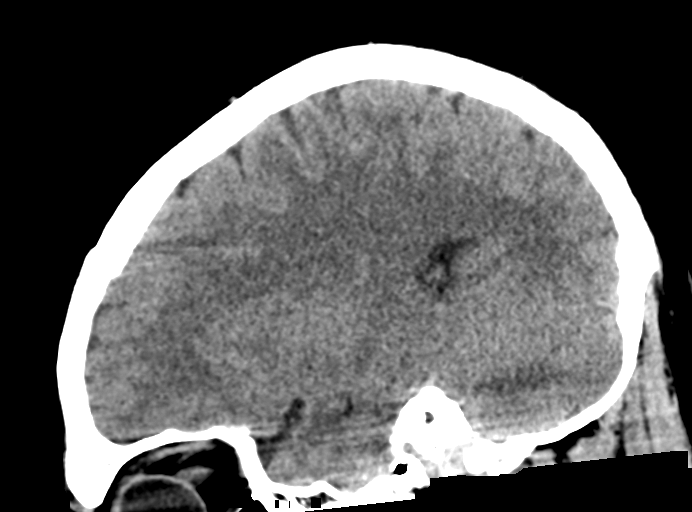
[im 29/58  brain]
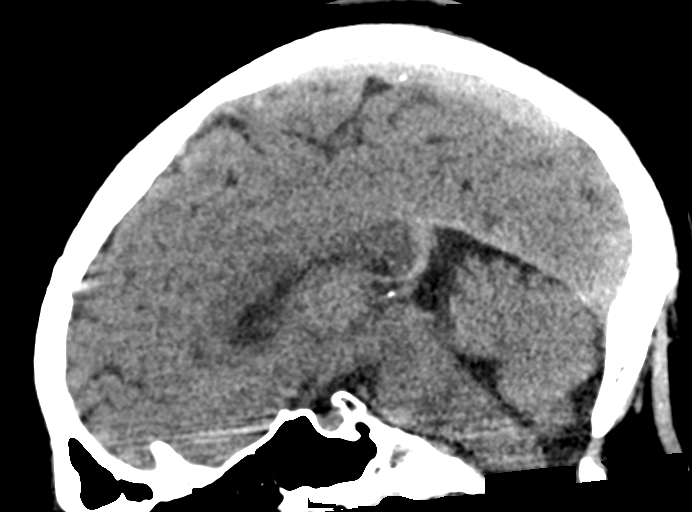
[im 39/58  brain]
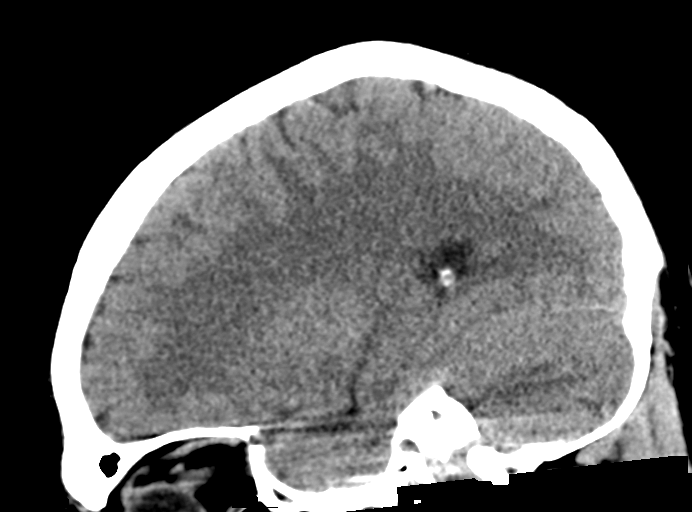

[Series 5: head wo · axial · 0.47mm/px · z∈[-120,+5]mm · 10 of 31 slices shown, 13 images]
[im 3/31  brain]
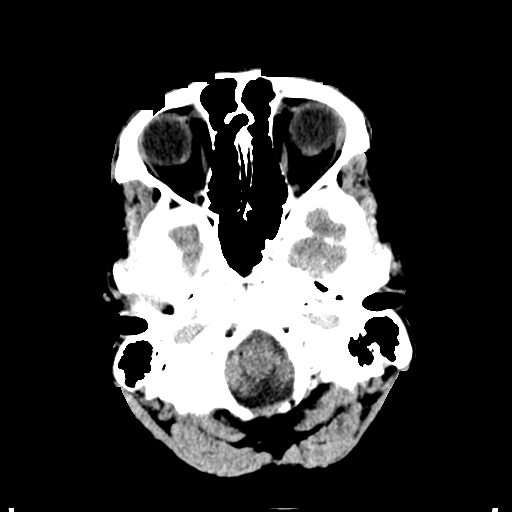
[im 3/31  bone]
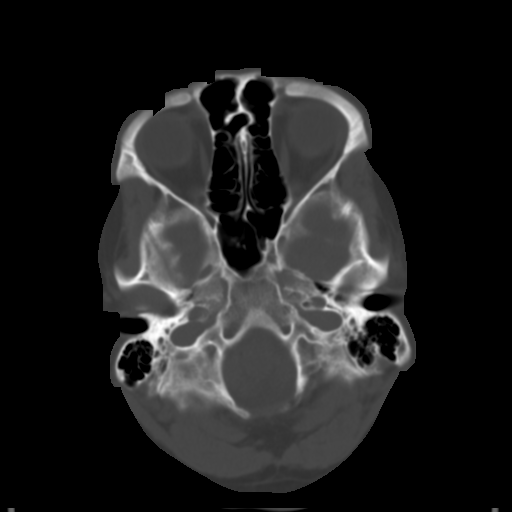
[im 6/31  brain]
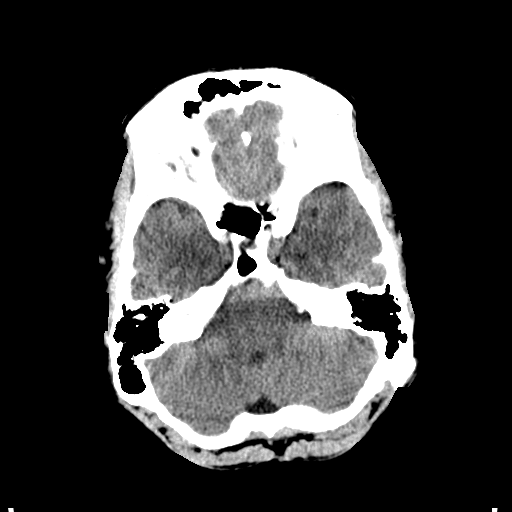
[im 9/31  brain]
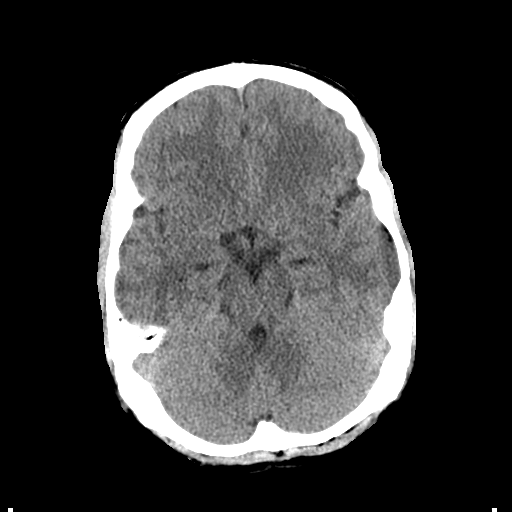
[im 11/31  brain]
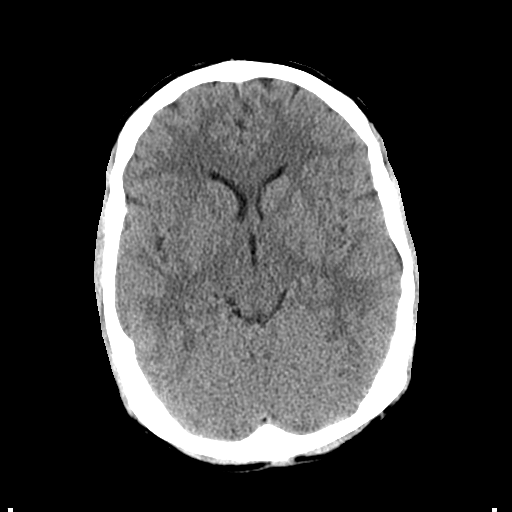
[im 14/31  brain]
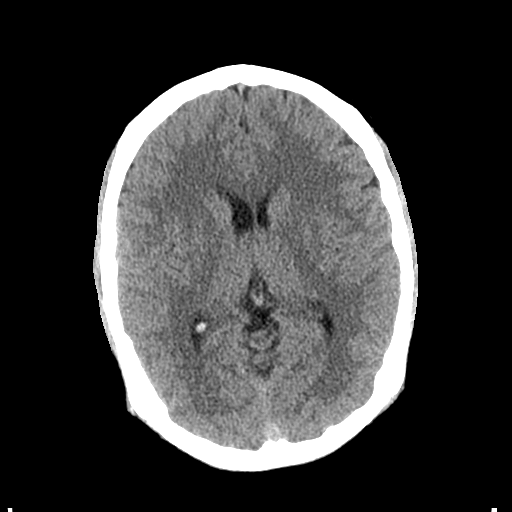
[im 14/31  bone]
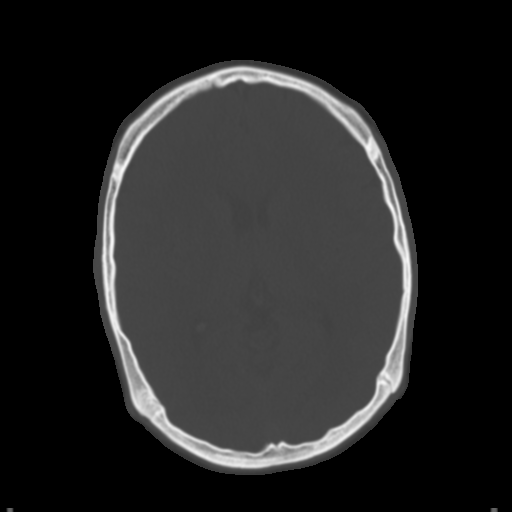
[im 17/31  brain]
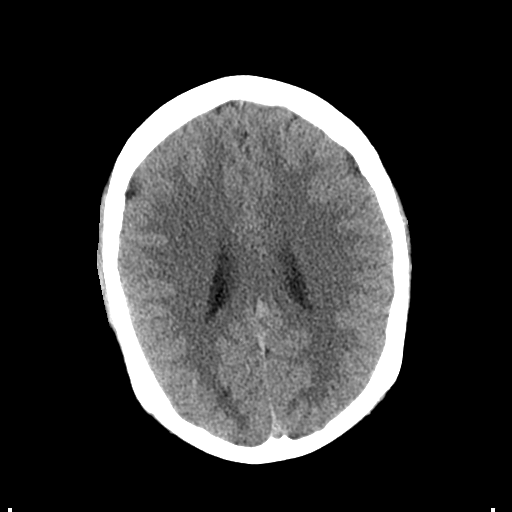
[im 20/31  brain]
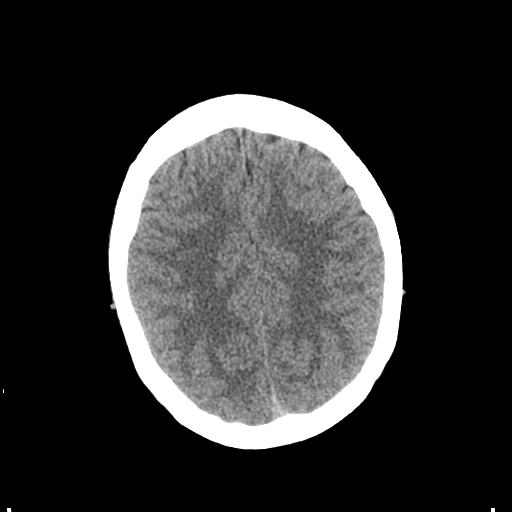
[im 23/31  brain]
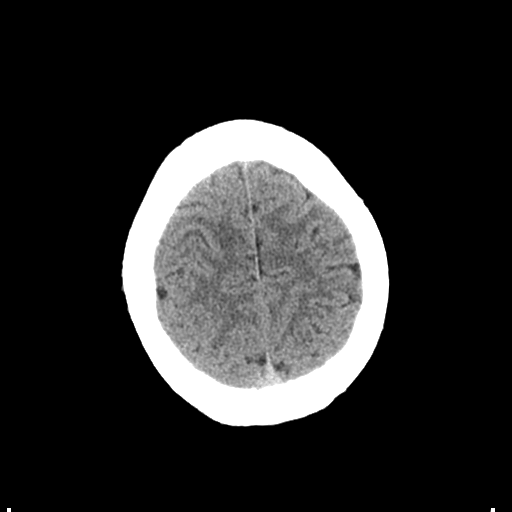
[im 25/31  brain]
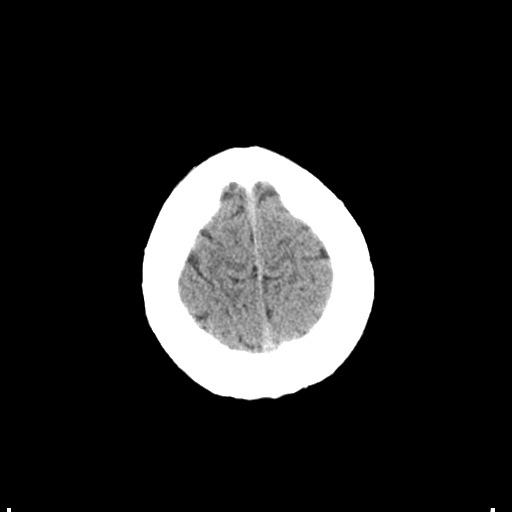
[im 25/31  bone]
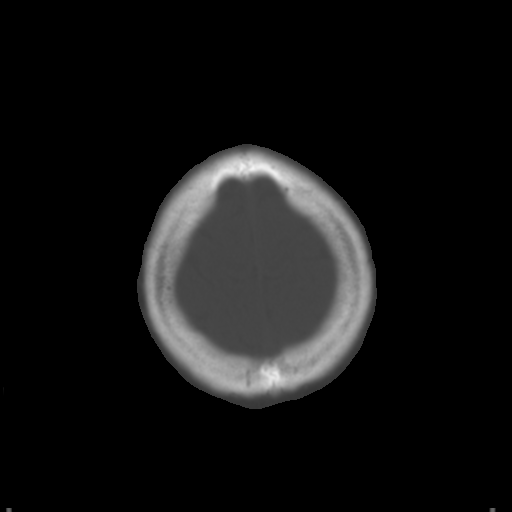
[im 28/31  brain]
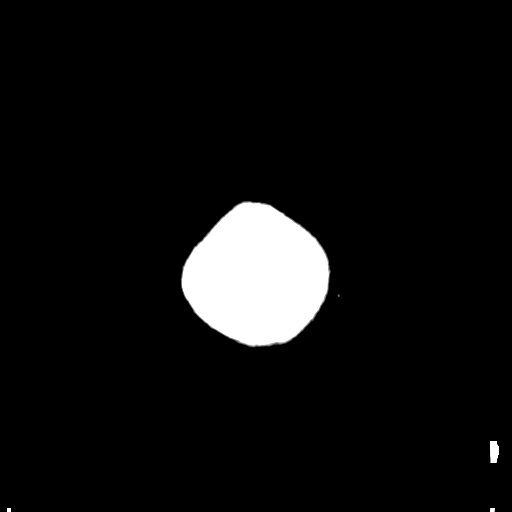

[16 of 47 positions shown; findings below may reference images not displayed]

FINDINGS: Brain: No evidence of acute infarction, hemorrhage, hydrocephalus,
extra-axial collection or mass lesion/mass effect.

Vascular: No hyperdense vessel or unexpected calcification.

Skull: Normal. Negative for fracture or focal lesion.

Sinuses/Orbits: No acute finding.

Other: None
IMPRESSION: Negative

## 2022-02-10 DEATH — deceased
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
